# Patient Record
Sex: Female | Born: 1945
Health system: Southern US, Community
[De-identification: ages and names within clinical notes are randomized; demographics above are authoritative.]

## PROBLEM LIST (undated history)

## (undated) DIAGNOSIS — T7840XA Allergy, unspecified, initial encounter: Secondary | ICD-10-CM

## (undated) DIAGNOSIS — R220 Localized swelling, mass and lump, head: Secondary | ICD-10-CM

## (undated) DIAGNOSIS — Z91018 Allergy to other foods: Secondary | ICD-10-CM

## (undated) DIAGNOSIS — L509 Urticaria, unspecified: Secondary | ICD-10-CM

## (undated) HISTORY — PX: OTHER SURGICAL HISTORY: SHX169

## (undated) HISTORY — DX: Urticaria, unspecified: L50.9

## (undated) HISTORY — PX: REDUCTION MAMMAPLASTY: SUR839

## (undated) HISTORY — PX: ABDOMINAL HYSTERECTOMY: SHX81

## (undated) HISTORY — PX: HERNIA REPAIR: SHX51

## (undated) HISTORY — DX: Allergy, unspecified, initial encounter: T78.40XA

## (undated) HISTORY — DX: Allergy to other foods: Z91.018

## (undated) HISTORY — DX: Localized swelling, mass and lump, head: R22.0

---

## 1997-07-08 ENCOUNTER — Other Ambulatory Visit: Admission: RE | Admit: 1997-07-08 | Discharge: 1997-07-08 | Payer: Self-pay | Admitting: Obstetrics & Gynecology

## 1998-07-15 ENCOUNTER — Other Ambulatory Visit: Admission: RE | Admit: 1998-07-15 | Discharge: 1998-07-15 | Payer: Self-pay | Admitting: Obstetrics & Gynecology

## 1998-12-30 ENCOUNTER — Encounter: Payer: Self-pay | Admitting: Obstetrics & Gynecology

## 1998-12-30 ENCOUNTER — Encounter: Admission: RE | Admit: 1998-12-30 | Discharge: 1998-12-30 | Payer: Self-pay | Admitting: Obstetrics & Gynecology

## 1999-06-08 ENCOUNTER — Encounter: Payer: Self-pay | Admitting: Orthopedic Surgery

## 1999-06-08 ENCOUNTER — Encounter: Admission: RE | Admit: 1999-06-08 | Discharge: 1999-06-08 | Payer: Self-pay | Admitting: Orthopedic Surgery

## 1999-08-18 ENCOUNTER — Other Ambulatory Visit: Admission: RE | Admit: 1999-08-18 | Discharge: 1999-08-18 | Payer: Self-pay | Admitting: Obstetrics & Gynecology

## 2000-01-05 ENCOUNTER — Encounter: Admission: RE | Admit: 2000-01-05 | Discharge: 2000-01-05 | Payer: Self-pay | Admitting: Obstetrics & Gynecology

## 2000-01-05 ENCOUNTER — Encounter: Payer: Self-pay | Admitting: Obstetrics & Gynecology

## 2000-09-07 ENCOUNTER — Other Ambulatory Visit: Admission: RE | Admit: 2000-09-07 | Discharge: 2000-09-07 | Payer: Self-pay | Admitting: Obstetrics & Gynecology

## 2001-01-09 ENCOUNTER — Encounter: Admission: RE | Admit: 2001-01-09 | Discharge: 2001-01-09 | Payer: Self-pay | Admitting: Obstetrics & Gynecology

## 2001-01-09 ENCOUNTER — Encounter: Payer: Self-pay | Admitting: Obstetrics & Gynecology

## 2001-10-03 ENCOUNTER — Other Ambulatory Visit: Admission: RE | Admit: 2001-10-03 | Discharge: 2001-10-03 | Payer: Self-pay | Admitting: Obstetrics & Gynecology

## 2001-10-16 ENCOUNTER — Encounter: Admission: RE | Admit: 2001-10-16 | Discharge: 2001-10-16 | Payer: Self-pay | Admitting: Family Medicine

## 2001-10-16 ENCOUNTER — Encounter: Payer: Self-pay | Admitting: Family Medicine

## 2002-01-16 ENCOUNTER — Encounter: Admission: RE | Admit: 2002-01-16 | Discharge: 2002-01-16 | Payer: Self-pay | Admitting: Obstetrics & Gynecology

## 2002-01-16 ENCOUNTER — Encounter: Payer: Self-pay | Admitting: Obstetrics & Gynecology

## 2002-01-22 ENCOUNTER — Encounter: Admission: RE | Admit: 2002-01-22 | Discharge: 2002-01-22 | Payer: Self-pay | Admitting: Diagnostic Radiology

## 2002-10-28 ENCOUNTER — Other Ambulatory Visit: Admission: RE | Admit: 2002-10-28 | Discharge: 2002-10-28 | Payer: Self-pay | Admitting: Obstetrics & Gynecology

## 2002-11-27 ENCOUNTER — Encounter: Admission: RE | Admit: 2002-11-27 | Discharge: 2002-11-27 | Payer: Self-pay | Admitting: Family Medicine

## 2002-11-27 ENCOUNTER — Encounter: Payer: Self-pay | Admitting: Family Medicine

## 2003-01-06 ENCOUNTER — Encounter: Payer: Self-pay | Admitting: Obstetrics & Gynecology

## 2003-01-24 ENCOUNTER — Encounter: Admission: RE | Admit: 2003-01-24 | Discharge: 2003-01-24 | Payer: Self-pay | Admitting: Diagnostic Radiology

## 2003-08-06 ENCOUNTER — Encounter: Admission: RE | Admit: 2003-08-06 | Discharge: 2003-08-06 | Payer: Self-pay | Admitting: Family Medicine

## 2003-10-30 ENCOUNTER — Encounter: Admission: RE | Admit: 2003-10-30 | Discharge: 2003-10-30 | Payer: Self-pay | Admitting: Family Medicine

## 2003-11-06 ENCOUNTER — Other Ambulatory Visit: Admission: RE | Admit: 2003-11-06 | Discharge: 2003-11-06 | Payer: Self-pay | Admitting: Obstetrics & Gynecology

## 2004-01-07 ENCOUNTER — Encounter: Admission: RE | Admit: 2004-01-07 | Discharge: 2004-01-07 | Payer: Self-pay | Admitting: Obstetrics & Gynecology

## 2004-07-23 ENCOUNTER — Encounter: Admission: RE | Admit: 2004-07-23 | Discharge: 2004-07-23 | Payer: Self-pay | Admitting: Family Medicine

## 2004-12-14 ENCOUNTER — Other Ambulatory Visit: Admission: RE | Admit: 2004-12-14 | Discharge: 2004-12-14 | Payer: Self-pay | Admitting: Obstetrics & Gynecology

## 2004-12-15 ENCOUNTER — Encounter: Admission: RE | Admit: 2004-12-15 | Discharge: 2004-12-15 | Payer: Self-pay | Admitting: Obstetrics & Gynecology

## 2005-01-13 ENCOUNTER — Encounter: Admission: RE | Admit: 2005-01-13 | Discharge: 2005-01-13 | Payer: Self-pay | Admitting: Obstetrics & Gynecology

## 2005-03-14 ENCOUNTER — Encounter: Admission: RE | Admit: 2005-03-14 | Discharge: 2005-03-14 | Payer: Self-pay | Admitting: Obstetrics & Gynecology

## 2005-04-21 ENCOUNTER — Encounter: Admission: RE | Admit: 2005-04-21 | Discharge: 2005-04-21 | Payer: Self-pay | Admitting: Family Medicine

## 2005-05-11 ENCOUNTER — Encounter: Admission: RE | Admit: 2005-05-11 | Discharge: 2005-05-11 | Payer: Self-pay | Admitting: Family Medicine

## 2005-05-30 ENCOUNTER — Encounter: Admission: RE | Admit: 2005-05-30 | Discharge: 2005-05-30 | Payer: Self-pay | Admitting: Obstetrics & Gynecology

## 2005-06-02 ENCOUNTER — Inpatient Hospital Stay (HOSPITAL_COMMUNITY): Admission: RE | Admit: 2005-06-02 | Discharge: 2005-06-06 | Payer: Self-pay | Admitting: General Surgery

## 2005-06-02 ENCOUNTER — Encounter (INDEPENDENT_AMBULATORY_CARE_PROVIDER_SITE_OTHER): Payer: Self-pay | Admitting: Specialist

## 2005-06-02 ENCOUNTER — Encounter (INDEPENDENT_AMBULATORY_CARE_PROVIDER_SITE_OTHER): Payer: Self-pay | Admitting: *Deleted

## 2005-10-20 ENCOUNTER — Encounter: Admission: RE | Admit: 2005-10-20 | Discharge: 2005-10-20 | Payer: Self-pay | Admitting: General Surgery

## 2005-12-31 ENCOUNTER — Inpatient Hospital Stay (HOSPITAL_COMMUNITY): Admission: RE | Admit: 2005-12-31 | Discharge: 2006-01-01 | Payer: Self-pay | Admitting: General Surgery

## 2006-01-16 ENCOUNTER — Encounter: Admission: RE | Admit: 2006-01-16 | Discharge: 2006-01-16 | Payer: Self-pay | Admitting: Obstetrics & Gynecology

## 2006-02-17 ENCOUNTER — Ambulatory Visit: Payer: Self-pay | Admitting: Internal Medicine

## 2006-02-24 ENCOUNTER — Ambulatory Visit (HOSPITAL_BASED_OUTPATIENT_CLINIC_OR_DEPARTMENT_OTHER): Admission: RE | Admit: 2006-02-24 | Discharge: 2006-02-24 | Payer: Self-pay | Admitting: Internal Medicine

## 2006-03-05 ENCOUNTER — Ambulatory Visit: Payer: Self-pay | Admitting: Internal Medicine

## 2006-03-14 ENCOUNTER — Ambulatory Visit: Payer: Self-pay | Admitting: Internal Medicine

## 2006-06-13 ENCOUNTER — Ambulatory Visit: Payer: Self-pay | Admitting: Internal Medicine

## 2007-01-18 ENCOUNTER — Encounter: Admission: RE | Admit: 2007-01-18 | Discharge: 2007-01-18 | Payer: Self-pay | Admitting: Obstetrics & Gynecology

## 2007-11-26 ENCOUNTER — Encounter: Admission: RE | Admit: 2007-11-26 | Discharge: 2007-11-26 | Payer: Self-pay | Admitting: Family Medicine

## 2008-01-21 ENCOUNTER — Encounter: Admission: RE | Admit: 2008-01-21 | Discharge: 2008-01-21 | Payer: Self-pay | Admitting: Obstetrics & Gynecology

## 2008-03-10 ENCOUNTER — Encounter: Admission: RE | Admit: 2008-03-10 | Discharge: 2008-03-10 | Payer: Self-pay | Admitting: Obstetrics & Gynecology

## 2008-03-24 ENCOUNTER — Encounter: Admission: RE | Admit: 2008-03-24 | Discharge: 2008-03-24 | Payer: Self-pay | Admitting: Obstetrics & Gynecology

## 2008-04-03 ENCOUNTER — Encounter: Admission: RE | Admit: 2008-04-03 | Discharge: 2008-04-03 | Payer: Self-pay | Admitting: Family Medicine

## 2008-05-14 ENCOUNTER — Encounter: Admission: RE | Admit: 2008-05-14 | Discharge: 2008-05-14 | Payer: Self-pay | Admitting: Podiatry

## 2008-10-06 ENCOUNTER — Encounter: Admission: RE | Admit: 2008-10-06 | Discharge: 2008-10-06 | Payer: Self-pay | Admitting: Family Medicine

## 2008-12-03 ENCOUNTER — Encounter: Admission: RE | Admit: 2008-12-03 | Discharge: 2008-12-03 | Payer: Self-pay | Admitting: Family Medicine

## 2008-12-10 ENCOUNTER — Encounter: Admission: RE | Admit: 2008-12-10 | Discharge: 2008-12-10 | Payer: Self-pay | Admitting: Orthopedic Surgery

## 2008-12-10 ENCOUNTER — Encounter: Admission: RE | Admit: 2008-12-10 | Discharge: 2008-12-10 | Payer: Self-pay | Admitting: Specialist

## 2008-12-31 ENCOUNTER — Encounter: Admission: RE | Admit: 2008-12-31 | Discharge: 2008-12-31 | Payer: Self-pay | Admitting: Family Medicine

## 2009-01-21 ENCOUNTER — Encounter: Admission: RE | Admit: 2009-01-21 | Discharge: 2009-01-21 | Payer: Self-pay | Admitting: Obstetrics & Gynecology

## 2010-01-04 ENCOUNTER — Encounter: Admission: RE | Admit: 2010-01-04 | Discharge: 2010-01-04 | Payer: Self-pay | Admitting: Obstetrics & Gynecology

## 2010-07-16 NOTE — Assessment & Plan Note (Signed)
Ashley Knapp                             PULMONARY OFFICE NOTE   NAME:Ashley Knapp, Ashley Knapp                   MRN:          [id number]  DATE:03/14/2006                            DOB:          January 09, 1946    PROBLEM:  Obstructive sleep apnea with insomnia.   HISTORY:  She returns after her sleep study with basic complaint not  changed.  Since her total hysterectomy she has had difficulty initiating  and maintaining sleep.  Her sleep study December 28 demonstrated mild  obstructive apnea with an index of 9.3 per hour.  Events were mainly  noted while she was sleeping on her back.  At home she prefers not to  sleep on her back because of positional backache.  She did not have leg  jerks or other events disturbing her sleep.  She took no bedtime  medication on that night and slept 315 minutes with a sleep efficiency  of 75% and awake after sleep onset for 83 minutes during the study  night.  The sleep pattern sounds like what she is describing at home.  Either Ambien CR 12.5 mg or Halcyon 0.25 mg will work well for her.  She  would just prefer not to need a medication to sleep comfortably.  We  spent time discussing available tools including cognitive behavioral  therapy and basics of good sleep hygiene, and we had a comparison  discussion of various sleep medications.  She was interested in trying  Rozerem.  She is not interested in trying CPAP.  With scores in her  range, I do not think CPAP is necessary from a medical standpoint.   MEDICATION:  1. Ambien CR 12.5 mg.  2. Estratest.  3. Halcyon 0.25 mg.  4. Occasional over-the-counter sinus medication.   No medication allergy.   OBJECTIVE:  Mildly obese with a small mandible, no stridor.  Lungs are  clear, breathing is unlabored.  Pulse is regular.  She seems calm and  comfortable.   IMPRESSION:  1. Insomnia with obstructive sleep apnea.  2. Sleep apnea component is very mild with an index of  9.3.   PLAN:  1. Try Rozerem 8 mg q.h.s. taken 20-30 minutes before bedtime, with      samples and backup prescription provided.  2. She will continue using either Ambien CR 12.5 mg or Halcyon 0.25 mg      until she is established on Rozerem, after a week to 10 days.  At      that point she will begin trying to skip the sedating medication.      I have discussed this process with her.  3. I have offered referral to behavioral medicine as an option for her      to explore nonpharmaceutical assistance with her sleep, but it      still seems that it was a hormone change at the time of her      hysterectomy that has precipitated her change in sleep pattern and      I would expect her gradually to get better over the next couple of  years.   We will schedule return 3 months, earlier p.r.n.     Clinton D. Maple Hudson, MD, Ashley Knapp, Ashley Knapp  Electronically Signed    CDY/MedQ  DD: 03/14/2006  DT: 03/15/2006  Job #: [id number]   cc:   Freddy Finner, M.D.  Bryan Lemma. Manus Gunning, M.D.

## 2010-07-16 NOTE — H&P (Signed)
Ashley Knapp, Ashley Knapp            ACCOUNT NO.:  [id number]   MEDICAL RECORD NO.:  [id number]          PATIENT TYPE:  INP   LOCATION:  NA                            FACILITY:  [facility]   PHYSICIAN:  Freddy Finner, M.D.   DATE OF BIRTH:  Jun 22, 1945   DATE OF ADMISSION:  06/02/2005  DATE OF DISCHARGE:                                HISTORY & PHYSICAL   ADMISSION DIAGNOSIS:  1.  Cholelithiasis, symptomatic.  2.  Complex cystic left adnexal mass, uterine leiomyomata, known significant      pelvic adhesions, long history of chronic pelvic pain, recent episode of      post menopausal bleeding with benign endometrium on endometrial biopsy.   HISTORY OF PRESENT ILLNESS:  The patient is a 65 year old black single  female, gravida 3, para 0, with a long-standing history of GYN problems  including chronic pelvic pain, uterine leiomyomata, episodes of abnormal  uterine bleeding, previous pelvic operative procedures including salpingo-  oophorectomy for ectopic pregnancy (presumably on the left), laparotomy for  lysis of adhesions and treatment of infertility.  She had laparoscopic  sterilization in 1980 with cauterization and division of her right fallopian  tube.  For years, she has been known to have a complex adnexal mass on the  left with cystic components.  This has, however, been very stable.  In the  fairly recent past, she has become symptomatic with gallstones and has  elected to proceed with cholecystectomy.  Because of her wish to continue  with hormone replacement therapy, her history of chronic pelvic pain, and  nonsignificant pelvic pathology, she has elected to combine the procedures  and she is now admitted for laparoscopic cholecystectomy by Dr. Lurene Shadow to be  followed by laparoscopically assisted vaginal hysterectomy with salpingo-  oophorectomy and lysis of pelvic adhesions.   REVIEW OF SYMPTOMS:  Her current review of systems is otherwise negative.  She does have episodic  symptoms with hemorrhoids.   Recent significant radiological examinations have included virtual  colonoscopy which was negative.  She also had MRI for evaluation of the  spine with findings of lumbar disc disease.  Abdominal ultrasounds have been  obtained in the office at least twice in the last 4-5 months with findings  of numerous leiomyomata ranging in size from 2.5 cm to 8 mm and continuing  to show persistent complex cystic left adnexal mass.   PAST MEDICAL HISTORY:  She has no known significant chronic medical  illnesses.   PAST SURGICAL HISTORY:  Tendon release of her left wrist, arthroscopic  surgery of her left knee.   ALLERGIES:  She has no known drug allergies.   CURRENT MEDICATIONS:  Her only current regular medication is CombiPatch  50/140 for her hormone replacement therapy.  She is not a cigarette smoker.  She uses alcohol only occasionally.   FAMILY HISTORY:  Her father died of lung cancer and emphysema.  Her mother  was hypertensive, diabetic, and had coronary artery disease and died of  renal failure.   PHYSICAL EXAMINATION:  HEENT:  Grossly within normal limits.  Thyroid gland is not palpably  enlarged  to my examination.  VITAL SIGNS:  Blood pressure in the office 138/82, weight 223.  CHEST:  Clear to auscultation throughout without rhonchi or rales present.  HEART:  Normal sinus rhythm without murmurs, gallops, and rubs.  BREASTS:  Exam is considered to be normal, no palpable masses, no nipple  discharge, no skin change, there are scars consistent with previous  reduction mammoplasty.  ABDOMEN:  Soft, nontender, without organomegaly or palpable masses.  EXTREMITIES:  No cyanosis, clubbing, and edema.  PELVIC:  External genitalia, vagina, and cervix were normal to inspection.  Bimanual reveals the uterus to be slightly enlarged, there is some adnexal  tenderness on the left but no definitive mass palpable just to clinical  exam.  RECTAL:  Normal.   Rectovaginal exam confirms the above findings.   ASSESSMENT:  1.  Symptomatic cholelithiasis.  2.  Symptomatic pelvic adhesive disease and uterine leiomyomata with post      menopausal bleeding and complex left adnexal mass.   PLAN:  Laparoscopic cholecystectomy to be followed by laparoscopically  assisted vaginal hysterectomy, lysis of pelvic adhesions, and unilateral  salpingo-oophorectomy.      Freddy Finner, M.D.  Electronically Signed     WRN/MEDQ  D:  06/01/2005  T:  06/01/2005  Job:  [id number]

## 2010-07-16 NOTE — Op Note (Signed)
NAMEALNISA, Ashley Knapp            ACCOUNT NO.:  [id number]   MEDICAL RECORD NO.:  [id number]          PATIENT TYPE:  INP   LOCATION:  9318                          FACILITY:  [facility]   PHYSICIAN:  Freddy Finner, M.D.   DATE OF BIRTH:  December 24, 1945   DATE OF PROCEDURE:  06/02/2005  DATE OF DISCHARGE:                                 OPERATIVE REPORT   PREOPERATIVE DIAGNOSES:  1.  Uterine leiomyomata.  2.  Persistent complex left adnexal mass.  3.  Known pelvic adhesions.   POSTOPERATIVE DIAGNOSES:  1.  Uterine leiomyomata.  2.  Persistent complex left adnexal mass.  3.  Known pelvic adhesions involving bladder, uterus, left adnexa, right      adnexa and lower abdominal wall.  4.  Complex peritoneal cyst of left adnexa.   OPERATIVE PROCEDURES:  1.  Laparoscopic inspection of pelvis following cholecystectomy performed by      Dr. Talmage Nap with lack of ability to visualize the pelvic structures.  2.  Total abdominal hysterectomy.  3.  Right salpingo-oophorectomy.  4.  Lysis of pelvic adhesions.  5.  Repair of perforation of bladder.  6.  Suprapubic cystotomy.   ANESTHESIA:  General.   ESTIMATED INTRAOPERATIVE BLOOD LOSS:  Total for both procedures 300 mL   OTHER INTRAOPERATIVE COMPLICATIONS:  None.   SURGEON:  Freddy Finner, M.D.   ASSISTANT:  Zelphia Cairo, MD   DESCRIPTION OF PROCEDURE:  The patient's history is recorded in detail in  the admission note, but briefly she has a long history of pelvic pain,  complex adnexal mass and fibroids; and recently had begun having  dysfunctional uterine bleeding with hormone replacement therapy.  She was  also recently found to have cholelithiasis which has become symptomatic.  A  planned joint procedure was arranged.  Dr. Talmage Nap performed the laparoscopic  cholecystectomy with my assistance prior to the hysterectomy.  The umbilical  port was then used to attempt to visualize the pelvic structures. This was  virtually impossible because  of the extensive amount of adhesions. At this  point it was elected to proceed with an abdominal approach to the procedure.   The umbilical trocar was removed and the fascial suture which was a  pursestring, Dexon-type suture, was tied to close the fascia at this level.  The skin incision was closed with interrupted subcuticular sutures of 3-0  Dexon.  There were three other laparoscopic ports from the cholecystectomy  which were also closed by me with interrupted subcuticular sutures of 3-0  Dexon and Steri-Strips.  The abdomen was reprepped as was the vagina.  A  Foley catheter was then placed and left in place.  Sterile drapes were  applied.   A lower abdominal transverse incision was made through an old scar and this  was carried sharply on down to fascia.  The subcutaneous bleeding vessels  were controlled with a Bovie.  The fascia was entered sharply and extended  to the extent of skin incision in a transverse direction with blunt and  sharp dissection.  A rectus sheath was developed superiorly and inferiorly  with blunt-and-sharp dissection.  Blunt dissection was attempted in the  midline.  Some diastasis of the rectus muscles was already present.  With  blunt dissection, the bladder was entered and was far advanced on the  anterior abdominal peritoneum.  This was on the dome of the bladder and no  where near the trigone.  The bladder opening was left in place until a  completion of the hysterectomy for assistance in identifying the bladder at  its reflection off of the uterus.  There were adhesions of the anterior cul-  de-sac.  These were carefully, bluntly and sharply taken down.  There was a  loop of small intestine adherent just superior to the entry point into the  peritoneum which was accomplished with careful blunt and sharp dissection.  The adhesions of the ileum were lysed sharply and carefully.  All adhesions  above the incision were not lysed.   The adhesions that  were lysed had created a loop in the bowel and it was  felt appropriate to release these.  With careful finger dissection, the  uterus could then be palpated and the clear, smooth cystic mass on the left  was identified.  It was consistent with a peritoneal cyst.  The left adnexa  was absent, but there were extensive adhesions where the left tube and ovary  had been removed before for ectopic pregnancy. There were adhesions along  the right broad ligament.  The proximal broad ligaments were grasped with  Kelly's.  The fallopian tube distal to the previous tubal ligation was  freed.  The ligature device was then used to develop the infundibulopelvic  ligament and 3 different pedicles.  The fallopian tube and ovary distal to  the ligated portion of the tube was then removed.  The round ligament was  then taken and upper broad ligament taken with the ligature.  Each pedicle  was divided consecutively.   Attention was turned anteriorly to completely release the bladder from the  uterus and the lower uterine segment and this was accomplished with sharp  and blunt dissection.  The uterine artery pedicles were then taken with a  ligature.  Cardinal ligaments were taken with ligature and divided. Bleeding  source near the uterine artery on the right was controlled with a suture  ligature of #0 Monocryl.  The angle of the vagina on the left and the  uterosacral ligament was clamped with a Heaney clamp and sharply divided.  This entered the vaginal canal.  This suture was ligated with a suture  ligature of #0 Monocryl in a Heaney fashion.  The cervix was then excised  with the Satinsky scissors. An angle suture was placed on the right hand  side anchoring the uterosacral to the angle of the vagina.  Additional  sutures were required in this location for complete hemostasis.   The cuff was closed with figure-of-eights of #0 Monocryl.  A horizontally oriented suture was required on the anterior cuff  for complete hemostasis  with some bleeding noted under the bladder reflection at that location.  This did produce complete hemostasis.  Irrigation was carried out and  hemostasis was still noted to be complete.  The defect in the bladder was  then closed using a purse-string suture of 2-0 chromic for the first layer  of closure.  Silastic Foley was placed through the skin in a suprapubic  location and placed into the bladder.  The pursestring suture was tied to  complete the closure around the Foley.  Interrupted sutures  of 2-0 chromic  were then used to create a second layer of closure in a vertical fashion.   Foley catheter was anchored to the skin with a 2-0 Nylon.  Fascia was closed  with running #0 PDS running from an angle to the midline on each side with a  catheter left in the midline through an opening in the fascia.  Subcutaneous  tissue was approximated with running 2-0 plain.  Skin was closed with skin  staples.  The patient tolerated the operative procedure well.  Urine output  was adequate at this point in time and urine was clearing.  She was awakened  and taken to the respiratory rate in good condition.      Freddy Finner, M.D.  Electronically Signed     WRN/MEDQ  D:  06/02/2005  T:  06/02/2005  Job:  [id number]

## 2010-07-16 NOTE — Assessment & Plan Note (Signed)
[facility]                             PULMONARY OFFICE NOTE   NAME:Knapp, Ashley OFFIELD                   MRN:          [id number]  DATE:06/13/2006                            DOB:          09-26-1945    PROBLEM LIST:  Obstructive sleep apnea with insomnia.   HISTORY:  Rozerem alone was no help. She used up the samples and quit.  Providing Rozerem with Ambien CR taken around 11:30 or 12:00 and getting  up at 7:30 seemed to carry on too long and Ambien CR by itself still  leaves her feeling somewhat groggy in the daytime. She did not try the  website that I had given her. We had discussed her very mild sleep apnea  with an index of 9.3 again. She may have lost a couple of pounds and is  trying to sleep off her back. She did not tolerate Lunesta because of  aftertaste.   MEDICATIONS:  1. Ambien CR 12.5 mg.  2. Estratest.   No medication allergy.   OBJECTIVE:  VITAL SIGNS:  Weight 212 pounds which is still heavy for  her. Blood pressure 130/74, pulse 70, room air saturation 98%.  GENERAL:  She is quite alert. No distress.  HEENT:  Nasal airway is not obstructed.  HEART:  Heart sounds are normal, no tremor.   IMPRESSION:  1. Insomnia since hysterectomy.  2. Mild sleep apnea with an index of 9.   PLAN:  1. Education again about weight loss and good sleep hygiene.  2. Try changing Ambien CR to Ambien 10 mg generic for a shorter half      life to avoid the daytime grogginess and aim for prn use as      discussed.  3. We are scheduling a return in 1 yr but earlier prn     Clinton D. Maple Hudson, MD, Tonny Bollman, FACP  Electronically Signed    CDY/MedQ  DD: 06/13/2006  DT: 06/14/2006  Job #: [id number]   cc:   Freddy Finner, M.D.  Bryan Lemma. Manus Gunning, M.D.

## 2010-07-16 NOTE — Op Note (Signed)
[name], [name]            ACCOUNT NO.:  [id number]   MEDICAL RECORD NO.:  [id number]          PATIENT TYPE:  INP   LOCATION:  9318                          FACILITY:  [facility]   PHYSICIAN:  Ashley Knapp, M.D.   DATE OF BIRTH:  March 06, 1945   DATE OF PROCEDURE:  06/02/2005  DATE OF DISCHARGE:                                 OPERATIVE REPORT   PREOPERATIVE DIAGNOSIS:  Chronic calculous cholecystitis.   POSTOPERATIVE DIAGNOSIS:  Chronic calculous cholecystitis.   PROCEDURE:  Laparoscopic cholecystectomy with intraoperative cholangiogram.   SURGEON:  Ashley Knapp, M.D.   ASSISTANT:  Ashley Knapp, M.D.   ANESTHESIA:  General.   NOTE:  Ashley Knapp is a 65 year old female presenting with symptoms of upper  abdomen pain, bloating and nausea.  She was discovered on MRI to have  cholelithiasis; this was later confirmed on gallbladder ultrasound.  Liver  functions have remained normal, no chills or fevers.  The patient comes to  the operating room now for laparoscopic cholecystectomy and intraoperative  cholangiogram.  At the same time, she is also being evaluated by Ashley Knapp and it is planned to do at this operation, a laparoscopic-assisted  total abdominal hysterectomy.   The LAVH will be dictated in a separate note by Ashley Knapp.   The risks and potential benefits of surgery have been fully discussed with  the patient, all questions answered and consent obtained.   DESCRIPTION OF PROCEDURE:  Following the induction of satisfactory general  anesthesia with the patient positioned supinely, the abdomen is prepped and  draped to be included in a sterile operative field.  Open laparoscopy is  carried out in an infraumbilical incision and with insertion of a Hasson  cannula and insufflation of the peritoneal cavity to 14 mmHg pressure using  carbon dioxide.  Camera is inserted and visual exploration is carried out.  The liver edges are sharp and liver surface is smooth.  The  anterior gastric  wall and duodenal sweep appeared to be normal.  There are multiple adhesions  up to the gallbladder wall.  On inspection the pelvis, there are multiple  adhesions in the pelvis from previous surgical procedures.  Under direct  vision, epigastric and lateral ports are placed and the gallbladder is then  grasped and retracted cephalad and dissection carried down toward the  ampulla with isolation of the cystic artery and cystic duct.  The cystic  artery was passed anteriorly and it was then doubly clipped and transected.  The cystic duct was isolated, clipped proximally and opened.  A cystic duct  cholangiogram was then carried out with a Reddick catheter passed into the  abdomen through a 16-gauge Angiocath and into the cystic duct.  The  resulting cholangiogram was carried out under fluoroscopic guidance,  injecting 0.5% Hypaque into the biliary system.  The cholangiogram showed  mildly dilated common duct, no filling defect, with normal hepatic radicles  on the right and left.  There was prompt flow of contrast into the duodenum.   The cystic duct catheter was then removed and the cystic duct was triply  clipped and  transected.  The gallbladder was then dissected free from the  liver bed using electrocautery and maintaining hemostasis throughout the  course of dissection.  At the end of the dissection, the liver bed was  thoroughly checked for hemostasis and noted to be dry.  The right upper  quadrant was thoroughly irrigated with multiple aliquots of normal saline.  The gallbladder was then placed in the Endopouch and then retrieved through  the umbilical incision without difficulty.  Under direct visualization, both  the lateral trocars were removed; the epigastric and umbilical trocars were  left in place, at which point Ashley Knapp assumed charge of the operation and  begun dissection for his LAVH.      Ashley Knapp, M.D.  Electronically Signed     PB/MEDQ   D:  06/02/2005  T:  06/03/2005  Job:  [id number]   cc:   Ashley Knapp, M.D.  Fax: [phone]

## 2010-07-16 NOTE — Op Note (Signed)
Ashley Knapp, Ashley Knapp            ACCOUNT NO.:  [id number]   MEDICAL RECORD NO.:  [id number]          PATIENT TYPE:  OIB   LOCATION:  1606                         [facility]ACILITY:  [facility]   PHYSICIAN:  Leonie Man, M.D.   DATE OF BIRTH:  1945-10-10   DATE OF PROCEDURE:  12/30/2005  DATE OF DISCHARGE:                                 OPERATIVE REPORT   PREOPERATIVE DIAGNOSIS:  Ventral hernia with incarcerated urinary bladder.   POSTOPERATIVE DIAGNOSIS:  Ventral hernia with incarcerated urinary bladder.   PROCEDURE:  Reduction of urinary bladder and repair of ventral hernia.   CO-SURGEONS:  Leonie Man, M.D.  Veverly Fells. Vernie Ammons, M.D.   ANESTHESIA:  General.   SPECIMENS:  There were no specimens sent to lab.   ESTIMATED BLOOD LOSS:  Minimal.   COMPLICATIONS:  None.   DISPOSITION:  The patient returned to the PACU in excellent condition.   INDICATIONS:  The patient is a 65 year old female who underwent recent  abdominal hysterectomy and placement of a suprapubic catheter during the  course of the operation.  She subsequently developed herniation of her  bladder through the fascial defect and is having some voiding symptoms and  pain because of it.  On the CT scan, it shows a significant herniation of  approximately 1/3 of the bladder into the hernia sac and under the skin.  The patient comes to the operating room now after the risks and potential  benefits of surgery have been fully discussed, all questions answered, and  consent obtained.   DESCRIPTION OF PROCEDURE:  The patient is positioned supinely.  A large  Foley catheter was placed within the bladder.  Following the induction of  satisfactory general anesthesia, the abdomen was prepped and draped to be  included in a sterile operative field.  An elliptical incision is made over  the old scar cicatrix and this was deepened through skin and subcutaneous  tissue down to the hernia sac.  The hernia sac was dissected free,  carrying  the superior flap towards the umbilicus and inferior flap was carried down  over the pubic tubercle onto the pudendal skin.  The hernia edges were  identified and the bladder was dissected free from the hernia defect in a  complete circumferential fashion freeing the entire fascia up.  The bladder  was then reduced back into the retroperitoneal space and the rectus fascia  was closed over the bladder with interrupted sutures of #1 Novofil.  An  onlay patch of mesh was then placed over the suture line and sutured into  the fascial wall with multiple sutures of #1 Novofil.  I then used a  Protacker to secure the remainder of the mesh into the fascia in two rows of  staples.  A 19-French Blake drain was placed under the flaps for additional  drainage.  Sponge, instruments and sharp counts were verified.  The  subcutaneous tissues were then thoroughly irrigated with normal saline and  the subcutaneous tissues closed with a  running 2-0 Vicryl suture.  The skin was closed with running 4-0 Monocryl  suture and then reinforced with Steri-Strips.  Sterile dressings were  applied, the anesthetic reversed, and the patient removed from the operating  room to the recovery room in stable condition.  She tolerated the procedure  well.      Leonie Man, M.D.  Electronically Signed     PB/MEDQ  D:  12/30/2005  T:  12/30/2005  Job:  [id number]   cc:   Veverly Fells. Vernie Ammons, M.D.  Fax: [phone]

## 2010-07-16 NOTE — Discharge Summary (Signed)
NAMEERRIN, WHITELAW            ACCOUNT NO.:  [id number]   MEDICAL RECORD NO.:  [id number]          PATIENT TYPE:  INP   LOCATION:  9318                          FACILITY:  [facility]   PHYSICIAN:  Freddy Finner, M.D.   DATE OF BIRTH:  Jan 09, 1946   DATE OF ADMISSION:  06/02/2005  DATE OF DISCHARGE:  06/06/2005                                 DISCHARGE SUMMARY   DISCHARGE DIAGNOSES:  Symptomatic cholelithiasis and uterine leiomyomata,  dense pelvic adhesions with peritoneal inclusion cyst of left adnexa,  uterine fibroids, chronic pelvic pain, dysfunctional uterine bleeding.   OPERATIVE PROCEDURE:  Laparoscopic cholecystectomy, total abdominal  hysterectomy, right salpingo-oophorectomy, resection of peritoneal inclusion  cyst of left adnexa, extensive adhesiolysis of pelvic adhesions and bowel  adhesions.   INTRAOPERATIVE COMPLICATION:  Blunt entry of bladder on abdominal  hysterectomy.   SECONDARY PROCEDURE:  Repair bladder defect and suprapubic cystotomy.   OTHER COMPLICATIONS:  None.   DISPOSITION:  The patient is in satisfactory improved condition at time of  discharge.  She is tolerating a regular diet.  She is ambulating without  difficulty.  She is having adequate bowel function.  She is discharged home  on a regular diet. She is to have progressively increasing physical activity  but no heavy lifting, no vaginal entry.  She is to return to my office  approximately 1 week from surgery for removal of suprapubic catheter.  She  is to follow-up with Dr. Lurene Shadow as per his instructions for postoperative  visit there.   DISCHARGE MEDICATIONS:  1.  Percocet 5/325 one or two every 4-6 hours as needed for pain.  2.  In addition she is to use ibuprofen 600 mg every 6 hours as needed for      pain.  3.  She is to complete a Z-Pak which she has been taking in hospital for      bronchitis.  4.  She is to use Vivelle 0.1 dot every 3-4 days for hormone placement      therapy.   HISTORY OF PRESENT ILLNESS:  Details of present illness are recorded in the  admission note. Briefly the patient had documented cholelithiasis with  recent onset of symptoms which would require surgery.  She has had  longstanding history of complex pelvic mass, fibroids, and episodes of  pelvic pain and recently some dysfunctional bleeding. She was admitted now  for combined surgery.  Physical findings on admission were more remarkable  for ultrasound documented issues as noted above.   LABORATORY STUDIES DURING THIS ADMISSION:  Admission hemoglobin of 13.7 with  normal CBC. Chemistry profile was normal except for random glucose of 108.  Prothrombin time and PTT were normal.  Urinalysis was normal.  Postoperatively hemoglobin was 11.1 on the first postoperative day and was  10.6 on the third postoperative day.   HOSPITAL COURSE:  The patient was admitted on the morning of surgery. She  was placed in PAS hose.  She was given a gram of Ancef preoperatively.  She  was brought to the operating room and prepped in the usual fashion for  cholecystectomy which was  performed by Dr. Lurene Shadow.  This procedure was  without complication.  Laparoscopic examination of the pelvis and lower  abdomen was carried out in hopes of proceeding with laparoscopically-  assisted vaginal hysterectomy.  It was completely obvious immediately that  this would not be appropriate approach.  For that reason the patient was  then reprepped and draped including vaginal prep for total abdominal  hysterectomy.  Foley catheter was indwelling.  That procedure was  accomplished with one intraoperative complication and that is entry into the  bladder  on blunt dissection in the attempt to identify the peritoneal  cavity.  The bladder was advanced far up on the anterior abdominal wall.  With that exception no other complications were encountered and that  procedure was accomplished without significant further difficulty which   included extensive lysis of pelvic and lower abdominal adhesions, right  salpingo-oophorectomy, abdominal hysterectomy.  Postoperatively she had no  major complications.  She did have known bronchitis from preoperatively and  for that reason she was started on Z-Pak which she continues to the present  time. With this protocol she has remained afebrile throughout her hospital  stay. The abdominal incision is healing well. Suprapubic catheter is  draining well.  The upper abdominal incisions are intact and dry.  Her  condition is considered to be good on the fourth postoperative day and she  is discharged home with further disposition as noted above.  Pathology  studies were still pending at time of discharge.      Freddy Finner, M.D.  Electronically Signed     WRN/MEDQ  D:  06/06/2005  T:  06/06/2005  Job:  [id number]   cc:   Leonie Man, M.D.  [address]  [address]  [place]  [place] [address]

## 2010-07-16 NOTE — Assessment & Plan Note (Signed)
[facility]                             PULMONARY OFFICE NOTE   NAME:Coffield, EILISH MCDANIEL                   MRN:          [id number]  DATE:02/17/2006                            DOB:          1946-01-11    PULMONARY/SLEEP CONSULT:   PROBLEM:  The patient is a 65 year old woman, seen on kind referral in  sleep medicine consultation by Dr. Jennette Kettle, complaining of inability to  sleep through the night.   HISTORY:  Starting after total hysterectomy in April of this year, she  has found herself unable to sleep through the night, unless she takes  Ambien-CR.  Previously, she was always a night owl, but once asleep, she  slept well.  She would like to be able to sleep now, without medication.  She feels that she can fall asleep eventually, but not maintain sleep.  She has been put on Estratest.  Bed time now is around 11:30 to 12:30,  which is unchanged.  She used to expect five to five and a half hours'  sleep, but never achieves that much any longer before waking at 7:30  a.m. on week days, 8:30 to 9 on weekends.  Either Ambien or Halcion will  help sleep, but the best she can get now is about four and a half hours.  If anything, she is getting a little bit better with time.  She denies  specific physical discomforts.  She sleeps alone, is aware that she  snores and denies daytime sleepiness.  Sometimes she will wake from a  dream, able to hear traffic, but unable to move, in a pattern consistent  with sleep paralysis.  She is occasionally aware that her legs jerk, but  she does not feel restless.   MEDICATIONS:  1. Ambien-CR 12.5 mg.  2. Estratest.  3. Occasional over-the-counter sinus medication.   We have discussed stimulant meds, including decongestants.   ALLERGIES:  No medication allergy.   REVIEW OF SYSTEMS:  Snoring, difficulty initiating sleep, difficulty  maintaining sleep without a sleep aid.  Leg and body jerks.  Dozing in  the evening.   Vivid dreams that are frightening to her, sometimes  associated with sleep paralysis, where she says that she can hear  traffic outside, but cannot move.  No cataplexy, no hallucination.  Her  weight has been stable.   PAST HISTORY:  No history of thyroid disease or any ear, nose or throat  surgery.  Some previous allergy and nasal stuffiness.  Surgeries for  gallbladder, hysterectomy, tubal ligation, breast reduction, tendon  release, exploratory laparotomy with lysis of adhesions, ectopic  pregnancy, knee surgery.   SOCIAL HISTORY:  No tobacco.  Occasional social alcohol.  She cut back  caffeine to one cup in the morning and stopped soft drinks.  She is  single, no children.   FAMILY HISTORY:  Father had emphysema, mother had heart disease, nobody  known to have sleep problems.   OBJECTIVE:  Weight 214 pounds, BP 128/78, pulse regular at 79, room air  saturation 98%.  This is an obese, calm woman.  Palms are wet.  No  tremor.  No obvious rash or adenopathy.  Chest quiet, clear, unlabored  breathing.  Heart sounds regular, without murmur or gallop.  Palate  length 3/4.  No nasal obstruction.  No postnasal drainage.  There is no  thyromegaly or neck vein distention.  Voice quality is normal.  Affect  is not unusual.  There is no restlessness or tremor.   IMPRESSION:  Insomnia with difficulty initiating and maintaining sleep.  The key is that this began after hysterectomy and may represent a  hormonal transition change.  Sleep apnea and periodic limb movement in  sleep are not excluded.  Currently, I favor benign sleep paralysis,  rather than narcolepsy, and benign hypnic jerks, rather than restless  legs, but we will see what the sleep study looks like.   PLAN:  1. We are scheduling a split protocol nocturnal polysomnogram at the      [facility].  2. I have permitted continued use of Ambien-CR 12.5 mg until she      returns.     Clinton D. Maple Hudson, MD,  Tonny Bollman, FACP  Electronically Signed    CDY/MedQ  DD: 02/22/2006  DT: 02/22/2006  Job #: [id number]   cc:   Varney Baas, Dr.  Bryan Lemma. Manus Gunning, M.D.

## 2010-07-16 NOTE — Procedures (Signed)
Ashley Knapp, Ashley Knapp            ACCOUNT NO.:  [id number]   MEDICAL RECORD NO.:  [id number]          PATIENT TYPE:  OUT   LOCATION:  SLEEP CENTER                 FACILITY:  [facility]   PHYSICIAN:  Clinton D. Maple Hudson, MD, FCCP, FACPDATE OF BIRTH:  08-Nov-1947   DATE OF STUDY:  02/24/2006                            NOCTURNAL POLYSOMNOGRAM   INDICATION FOR STUDY:  Hypersomnia with sleep apnea.   EPWORTH SLEEPINESS SCORE:  5/14. BMI 34. Weight 212 pounds.   MEDICATIONS:  Ambien CR and estrogen.   SLEEP ARCHITECTURE:  Total sleep time 315 minutes with sleep efficiency  75%. Stage I with 3%, stage 2 79%, stages 3 and 4 absent. REM 18% of  total sleep time. Sleep latency 22 minutes, REM latency 84 minutes.  Awake after sleep onset 83 minutes, arousal index 3.2. No bedtime  medication was taken.   RESPIRATORY DATA:  Apnea hypopnea index (AHI, RDI) 9.3 obstructive  events per hour indicating mild obstructive sleep apnea/hypopnea  syndrome. This was reflecting a total of 49 hypopnea's. All events were  recorded while the patient was sleeping supine indicating a strong  positional effect. REM AHI 34.7 per hour. She did not qualify for CPAP  titration by split protocol on this study night because of insufficient  events distributed late in the study night.   OXYGEN DATA:  Mild to mod snoring with oxygen desaturation to a nadir of  85%. Mean oxygen saturation through the study was 95% on room air.   CARDIAC DATA:  Normal sinus rhythm.   MOVEMENT-PARASOMNIA:  Insignificant limb jerks. No bathroom visits.   IMPRESSIONS-RECOMMENDATIONS:  1. Mild obstructive sleep apnea/hypopnea syndrome. AHI 9.3 per hour      with strongly positional events limited to supine sleep positions      suggesting that sleep while flat on his back may be therapeutic.      Mild to moderate snoring with oxygen desaturation to a nadir of      85%.      Clinton D. Maple Hudson, MD, [facility], FACP  Diplomate, Biomedical engineer of  Sleep Medicine  Electronically Signed    CDY/MEDQ  D:  03/05/2006 12:41:48  T:  03/05/2006 21:55:21  Job:  [id number]

## 2010-11-24 ENCOUNTER — Other Ambulatory Visit: Payer: Self-pay | Admitting: Gastroenterology

## 2010-11-29 ENCOUNTER — Other Ambulatory Visit: Payer: Self-pay | Admitting: Obstetrics & Gynecology

## 2010-11-29 DIAGNOSIS — Z1231 Encounter for screening mammogram for malignant neoplasm of breast: Secondary | ICD-10-CM

## 2011-01-10 ENCOUNTER — Ambulatory Visit
Admission: RE | Admit: 2011-01-10 | Discharge: 2011-01-10 | Disposition: A | Discharge status: Home / Self Care | Payer: No Typology Code available for payment source | Source: Ambulatory Visit | Attending: Obstetrics & Gynecology | Admitting: Obstetrics & Gynecology

## 2011-01-10 DIAGNOSIS — Z1231 Encounter for screening mammogram for malignant neoplasm of breast: Secondary | ICD-10-CM

## 2011-07-26 ENCOUNTER — Other Ambulatory Visit: Payer: Self-pay | Admitting: Family Medicine

## 2011-07-26 ENCOUNTER — Ambulatory Visit
Admission: RE | Admit: 2011-07-26 | Discharge: 2011-07-26 | Disposition: A | Discharge status: Home / Self Care | Payer: No Typology Code available for payment source | Source: Ambulatory Visit | Attending: Family Medicine | Admitting: Family Medicine

## 2011-07-26 DIAGNOSIS — M25551 Pain in right hip: Secondary | ICD-10-CM

## 2011-11-28 ENCOUNTER — Other Ambulatory Visit: Payer: Self-pay | Admitting: Obstetrics & Gynecology

## 2011-11-28 DIAGNOSIS — Z1231 Encounter for screening mammogram for malignant neoplasm of breast: Secondary | ICD-10-CM

## 2012-01-16 ENCOUNTER — Ambulatory Visit
Admission: RE | Admit: 2012-01-16 | Discharge: 2012-01-16 | Disposition: A | Discharge status: Home / Self Care | Payer: No Typology Code available for payment source | Source: Ambulatory Visit | Attending: Obstetrics & Gynecology | Admitting: Obstetrics & Gynecology

## 2012-01-16 DIAGNOSIS — Z1231 Encounter for screening mammogram for malignant neoplasm of breast: Secondary | ICD-10-CM

## 2012-06-28 ENCOUNTER — Other Ambulatory Visit: Payer: Self-pay | Admitting: Family Medicine

## 2012-06-28 DIAGNOSIS — M25511 Pain in right shoulder: Secondary | ICD-10-CM

## 2012-07-03 ENCOUNTER — Ambulatory Visit
Admission: RE | Admit: 2012-07-03 | Discharge: 2012-07-03 | Disposition: A | Discharge status: Home / Self Care | Payer: No Typology Code available for payment source | Source: Ambulatory Visit | Attending: Family Medicine | Admitting: Family Medicine

## 2012-07-03 DIAGNOSIS — M25511 Pain in right shoulder: Secondary | ICD-10-CM

## 2012-07-05 ENCOUNTER — Other Ambulatory Visit: Payer: Self-pay | Admitting: Orthopedic Surgery

## 2012-07-05 ENCOUNTER — Ambulatory Visit
Admission: RE | Admit: 2012-07-05 | Discharge: 2012-07-05 | Disposition: A | Discharge status: Home / Self Care | Payer: No Typology Code available for payment source | Source: Ambulatory Visit | Attending: Orthopedic Surgery | Admitting: Orthopedic Surgery

## 2012-07-05 DIAGNOSIS — M7541 Impingement syndrome of right shoulder: Secondary | ICD-10-CM

## 2012-11-27 ENCOUNTER — Other Ambulatory Visit: Payer: Self-pay

## 2012-11-27 DIAGNOSIS — Z1231 Encounter for screening mammogram for malignant neoplasm of breast: Secondary | ICD-10-CM

## 2012-12-17 ENCOUNTER — Other Ambulatory Visit: Payer: Self-pay | Admitting: Gastroenterology

## 2013-01-01 ENCOUNTER — Ambulatory Visit
Admission: RE | Admit: 2013-01-01 | Discharge: 2013-01-01 | Disposition: A | Discharge status: Home / Self Care | Payer: PRIVATE HEALTH INSURANCE | Source: Ambulatory Visit | Attending: Physician Assistant | Admitting: Physician Assistant

## 2013-01-01 ENCOUNTER — Other Ambulatory Visit: Payer: Self-pay | Admitting: Physician Assistant

## 2013-01-01 DIAGNOSIS — R52 Pain, unspecified: Secondary | ICD-10-CM

## 2013-01-21 ENCOUNTER — Ambulatory Visit
Admission: RE | Admit: 2013-01-21 | Discharge: 2013-01-21 | Disposition: A | Discharge status: Home / Self Care | Payer: PRIVATE HEALTH INSURANCE | Source: Ambulatory Visit

## 2013-01-21 DIAGNOSIS — Z1231 Encounter for screening mammogram for malignant neoplasm of breast: Secondary | ICD-10-CM

## 2013-03-21 ENCOUNTER — Ambulatory Visit
Admission: RE | Admit: 2013-03-21 | Discharge: 2013-03-21 | Disposition: A | Discharge status: Home / Self Care | Payer: PRIVATE HEALTH INSURANCE | Source: Ambulatory Visit | Attending: Physician Assistant | Admitting: Physician Assistant

## 2013-03-21 ENCOUNTER — Other Ambulatory Visit: Payer: Self-pay | Admitting: Physician Assistant

## 2013-03-21 DIAGNOSIS — R059 Cough, unspecified: Secondary | ICD-10-CM

## 2013-03-21 DIAGNOSIS — R05 Cough: Secondary | ICD-10-CM

## 2013-11-20 ENCOUNTER — Other Ambulatory Visit: Payer: Self-pay

## 2013-11-20 DIAGNOSIS — Z1231 Encounter for screening mammogram for malignant neoplasm of breast: Secondary | ICD-10-CM

## 2014-01-06 ENCOUNTER — Ambulatory Visit: Payer: PRIVATE HEALTH INSURANCE

## 2014-01-20 ENCOUNTER — Ambulatory Visit
Admission: RE | Admit: 2014-01-20 | Discharge: 2014-01-20 | Disposition: A | Discharge status: Home / Self Care | Payer: PRIVATE HEALTH INSURANCE | Source: Ambulatory Visit

## 2014-01-20 DIAGNOSIS — Z1231 Encounter for screening mammogram for malignant neoplasm of breast: Secondary | ICD-10-CM

## 2014-03-11 ENCOUNTER — Other Ambulatory Visit: Payer: Self-pay | Admitting: Family Medicine

## 2014-03-11 ENCOUNTER — Ambulatory Visit
Admission: RE | Admit: 2014-03-11 | Discharge: 2014-03-11 | Disposition: A | Discharge status: Home / Self Care | Payer: PRIVATE HEALTH INSURANCE | Source: Ambulatory Visit | Attending: Family Medicine | Admitting: Family Medicine

## 2014-03-11 DIAGNOSIS — R0981 Nasal congestion: Secondary | ICD-10-CM

## 2014-03-25 ENCOUNTER — Ambulatory Visit (INDEPENDENT_AMBULATORY_CARE_PROVIDER_SITE_OTHER): Payer: PRIVATE HEALTH INSURANCE | Admitting: Podiatry

## 2014-03-25 ENCOUNTER — Encounter: Payer: Self-pay | Admitting: Podiatry

## 2014-03-25 ENCOUNTER — Ambulatory Visit (INDEPENDENT_AMBULATORY_CARE_PROVIDER_SITE_OTHER): Payer: PRIVATE HEALTH INSURANCE

## 2014-03-25 VITALS — BP 137/84 | HR 78 | Resp 16 | Ht 66.0 in | Wt 202.0 lb

## 2014-03-25 DIAGNOSIS — M2010 Hallux valgus (acquired), unspecified foot: Secondary | ICD-10-CM | POA: Diagnosis not present

## 2014-03-25 DIAGNOSIS — M2040 Other hammer toe(s) (acquired), unspecified foot: Secondary | ICD-10-CM

## 2014-03-25 NOTE — Patient Instructions (Signed)
Pre-Operative Instructions  Congratulations, you have decided to take an important step to improving your quality of life.  You can be assured that the doctors of [facility] will be with you every step of the way.  1. Plan to be at the surgery center/hospital at least 1 (one) hour prior to your scheduled time unless otherwise directed by the surgical center/hospital staff.  You must have a responsible adult accompany you, remain during the surgery and drive you home.  Make sure you have directions to the surgical center/hospital and know how to get there on time. 2. For hospital based surgery you will need to obtain a history and physical form from your family physician within 1 month prior to the date of surgery- we will give you a form for you primary physician.  3. We make every effort to accommodate the date you request for surgery.  There are however, times where surgery dates or times have to be moved.  We will contact you as soon as possible if a change in schedule is required.   4. No Aspirin/Ibuprofen for one week before surgery.  If you are on aspirin, any non-steroidal anti-inflammatory medications (Mobic, Aleve, Ibuprofen) you should stop taking it 7 days prior to your surgery.  You make take Tylenol  For pain prior to surgery.  5. Medications- If you are taking daily heart and blood pressure medications, seizure, reflux, allergy, asthma, anxiety, pain or diabetes medications, make sure the surgery center/hospital is aware before the day of surgery so they may notify you which medications to take or avoid the day of surgery. 6. No food or drink after midnight the night before surgery unless directed otherwise by surgical center/hospital staff. 7. No alcoholic beverages 24 hours prior to surgery.  No smoking 24 hours prior to or 24 hours after surgery. 8. Wear loose pants or shorts- loose enough to fit over bandages, boots, and casts. 9. No slip on shoes, sneakers are best. 10. Bring  your boot with you to the surgery center/hospital.  Also bring crutches or a walker if your physician has prescribed it for you.  If you do not have this equipment, it will be provided for you after surgery. 11. If you have not been contracted by the surgery center/hospital by the day before your surgery, call to confirm the date and time of your surgery. 12. Leave-time from work may vary depending on the type of surgery you have.  Appropriate arrangements should be made prior to surgery with your employer. 13. Prescriptions will be provided immediately following surgery by your doctor.  Have these filled as soon as possible after surgery and take the medication as directed. 14. Remove nail polish on the operative foot. 15. Wash the night before surgery.  The night before surgery wash the foot and leg well with the antibacterial soap provided and water paying special attention to beneath the toenails and in between the toes.  Rinse thoroughly with water and dry well with a towel.  Perform this wash unless told not to do so by your physician.  Enclosed: 1 Ice pack (please put in freezer the night before surgery)   1 Hibiclens skin cleaner   Pre-op Instructions  If you have any questions regarding the instructions, do not hesitate to call our office.  [place]: [address] [place], [place] [address] [phone]  [place]: [address] [place], [place] [address] [phone]  [place]: [address]  [place], [place] [address] [phone]  Dr. Richard   Tuchman DPM, Dr. Norman Regal DPM Dr. Richard Sikora DPM, Dr. M. Todd Hyatt DPM, Dr. Kathryn Egerton DPM 

## 2014-03-25 NOTE — Progress Notes (Signed)
   Subjective:    [name], female    DOB: 1945-10-03, 69 y.o.   MRN: [id number]  HPI Comments: Its mainly my right foot , i have bunions and hammertoes . My toes go numb and it has got worse lately over the past 6 months      Review of Systems  All other systems reviewed and are negative.      Objective:   Physical Exam: I have reviewed her past mental history medications allergy surgery social history and review of systems. Pulses are strongly palpable. Neurologic sensorium is intact per Semmes-Weinstein monofilament. Deep tendon reflexes are intact bilateral and muscle strength +5 over 5 dorsiflexion plantar flexors and inverters everters all into the musculature is intact. Orthopedic evaluation of a straight awl joints distal to the ankle for range of motion without crepitation. She does have hallux abductovalgus deformity of the right foot greater than the left with a cocked up medially dislocated second metatarsophalangeal joint and hammertoe deformity. She also has hammertoe deformities to toes #3 and number for the right foot. Flexible pes planus is noted bilateral. Cutaneous evaluation to straight supple well-hydrated cutis no erythema edema cellulitis drainage or odor.        Assessment & Plan:  Assessment: Hallux abductovalgus deformity right. Plantar flexed elongated second metatarsal right. Hammertoe deformity #2 #3 #4 of the right foot. Flat foot deformity bilateral.  Plan: Discussed etiology pathology conservative versus surgical therapies. We went over consent form today line by line number by number giving her ample time to ask questions she saw fit regarding an Austin bunion repair with screw a second metatarsal osteotomy with screw a hammertoe repair #2 #3 and #4 of the right foot with screws or pins. I answered all the questions to the best of my ability in layman's terms regarding these procedures. We did discuss the possible postop complications  which may include but are not limited to postop pain bleeding swelling infection need for further surgery overcorrection and under correction. She was dispensed a cam walker and we will schedule surgery for her in the near future. We did discuss the possible need for orthotics in the future because of her pes planus.

## 2014-04-15 ENCOUNTER — Telehealth: Payer: Self-pay | Admitting: *Deleted

## 2014-04-15 NOTE — Telephone Encounter (Signed)
I attempted to return her call.  I left her a message to call me back. 

## 2014-04-15 NOTE — Telephone Encounter (Signed)
Pt states she would like to set up surgery with Dr. Al CorpusHyatt, but [facility] is not in network.

## 2014-04-16 NOTE — Telephone Encounter (Signed)
I left messages for the patient to give me a call back.  I'm returning your call.

## 2014-04-17 NOTE — Telephone Encounter (Signed)
"  Seems as though we've been playing phone tag."  Yes, I was returning your call in regards to doing surgery at  [facility].  You said your insurance was out of network with them.  I spoke to [place] at [facility] and she said they are in-network with [employer].  "Well I had a co-worker that had surgery there and she said it is out-network.  So, I called and I was told they couldn't find them in the system."  Okay, let me have the surgical center check and see what they can find out and give you a call back.  "Okay, if they are not in network does Dr. Al CorpusHyatt do surgeries anywhere else like [facility]."  He normally does not do surgery anywhere else.  I called and asked Aram BeechamCynthia to research this matter.  "Fax me the insurance and the procedures and we will let you know."  I faxed the information to her.  She called me back and stated, "Her insurance is in-network.  If she has any questions, she can call and [facility] to [facility]."  I will let her know.  I called the patient and informed her I was told her insurance is in-network.  You can call and speak to [facility] with any questions.  You can reach her at [phone].  "Okay I will give her a call and once it's resolved, I will call you back to get this scheduled."

## 2014-05-29 ENCOUNTER — Telehealth: Payer: Self-pay | Admitting: *Deleted

## 2014-05-29 NOTE — Telephone Encounter (Signed)
"  I want to schedule surgery."  I returned her call.  "I'd like to schedule surgery in May."  Okay, Dr. Al CorpusHyatt is pretty open, when would you like to schedule.  " I'd like to do it on 07/04/2014."  Okay, we'll schedule it then.  Just remember not to eat or drink anything after midnight the night before.  Someone will need to go with you to the procedure.  The surgical center will call you a few days before and let you know the exact time to get there.  Did you receive a surgical packet?  "Yes, I need to go on-line and fill out the forms.  Do I go ahead and bring my FMLA forms over to be taken care of?"  Yes, you can bring them over to [place].  "Okay, thank you sorry for the phone tag."

## 2014-06-10 ENCOUNTER — Telehealth: Payer: Self-pay | Admitting: Podiatry

## 2014-06-10 NOTE — Telephone Encounter (Signed)
Patient called stating she wants to cancel her surgery scheduled with Dr. Al CorpusHyatt. I will go ahead and cancel the post-op appointment if it has already been scheduled. JMS

## 2014-06-12 NOTE — Telephone Encounter (Signed)
Ok

## 2014-06-12 NOTE — Telephone Encounter (Signed)
I left a message for patient to give me a call back if she wants to reschedule surgery scheduled for 07/04/2014.  I called surgical center to cancel surgery for 07/04/2014.  I spoke to HarmonyRenee.  "I'll take care of it."

## 2014-07-10 ENCOUNTER — Encounter: Payer: PRIVATE HEALTH INSURANCE | Admitting: Podiatry

## 2014-09-22 ENCOUNTER — Other Ambulatory Visit: Payer: Self-pay | Admitting: Obstetrics & Gynecology

## 2014-09-23 LAB — CYTOLOGY - PAP

## 2014-11-24 ENCOUNTER — Other Ambulatory Visit: Payer: Self-pay

## 2014-11-24 DIAGNOSIS — Z1231 Encounter for screening mammogram for malignant neoplasm of breast: Secondary | ICD-10-CM

## 2015-01-26 ENCOUNTER — Ambulatory Visit
Admission: RE | Admit: 2015-01-26 | Discharge: 2015-01-26 | Disposition: A | Discharge status: Home / Self Care | Payer: PRIVATE HEALTH INSURANCE | Source: Ambulatory Visit

## 2015-01-26 DIAGNOSIS — Z1231 Encounter for screening mammogram for malignant neoplasm of breast: Secondary | ICD-10-CM

## 2015-11-28 ENCOUNTER — Other Ambulatory Visit: Payer: Self-pay | Admitting: Family Medicine

## 2015-11-30 ENCOUNTER — Other Ambulatory Visit: Payer: Self-pay | Admitting: Obstetrics & Gynecology

## 2015-11-30 DIAGNOSIS — Z0000 Encounter for general adult medical examination without abnormal findings: Secondary | ICD-10-CM

## 2016-02-01 ENCOUNTER — Ambulatory Visit
Admission: RE | Admit: 2016-02-01 | Discharge: 2016-02-01 | Disposition: A | Discharge status: Home / Self Care | Payer: PRIVATE HEALTH INSURANCE | Source: Ambulatory Visit | Attending: Obstetrics & Gynecology | Admitting: Obstetrics & Gynecology

## 2016-02-01 DIAGNOSIS — Z0000 Encounter for general adult medical examination without abnormal findings: Secondary | ICD-10-CM

## 2016-08-11 ENCOUNTER — Other Ambulatory Visit (HOSPITAL_COMMUNITY): Payer: Self-pay | Admitting: Diagnostic Radiology

## 2016-08-11 DIAGNOSIS — K7400 Hepatic fibrosis, unspecified: Secondary | ICD-10-CM

## 2016-08-12 ENCOUNTER — Ambulatory Visit
Admission: RE | Admit: 2016-08-12 | Discharge: 2016-08-12 | Disposition: A | Discharge status: Home / Self Care | Payer: PRIVATE HEALTH INSURANCE | Source: Ambulatory Visit | Attending: Diagnostic Radiology | Admitting: Diagnostic Radiology

## 2016-08-12 DIAGNOSIS — K7400 Hepatic fibrosis, unspecified: Secondary | ICD-10-CM

## 2016-11-01 ENCOUNTER — Ambulatory Visit: Payer: PRIVATE HEALTH INSURANCE | Admitting: Allergy and Immunology

## 2016-11-02 ENCOUNTER — Encounter: Payer: Self-pay | Admitting: Allergy and Immunology

## 2016-11-02 ENCOUNTER — Ambulatory Visit (INDEPENDENT_AMBULATORY_CARE_PROVIDER_SITE_OTHER): Payer: PRIVATE HEALTH INSURANCE | Admitting: Allergy and Immunology

## 2016-11-02 VITALS — BP 128/74 | HR 78 | Temp 98.2°F | Resp 18 | Ht 65.0 in | Wt 215.0 lb

## 2016-11-02 DIAGNOSIS — J3089 Other allergic rhinitis: Secondary | ICD-10-CM

## 2016-11-02 DIAGNOSIS — H1045 Other chronic allergic conjunctivitis: Secondary | ICD-10-CM | POA: Diagnosis not present

## 2016-11-02 DIAGNOSIS — H04123 Dry eye syndrome of bilateral lacrimal glands: Secondary | ICD-10-CM | POA: Diagnosis not present

## 2016-11-02 DIAGNOSIS — T7800XD Anaphylactic reaction due to unspecified food, subsequent encounter: Secondary | ICD-10-CM

## 2016-11-02 DIAGNOSIS — H1013 Acute atopic conjunctivitis, bilateral: Secondary | ICD-10-CM

## 2016-11-02 MED ORDER — EPINEPHRINE 0.3 MG/0.3ML IJ SOAJ: 0.3000 mg | Freq: Once | INTRAMUSCULAR | 1 refills | Status: AC

## 2016-11-02 MED ORDER — OLOPATADINE HCL 0.7 % OP SOLN: 1.0000 [drp] | Freq: Every day | OPHTHALMIC | 5 refills | Status: AC

## 2016-11-02 NOTE — Patient Instructions (Addendum)
  1. Allergen avoidance measures  2. Auvi-Q, Benadryl, M.D./ER evaluation for allergic reaction  3. If needed:   A. OTC Systane or similar  B. Pazeo one drop each eye one time per day  4. Try to avoid all oral antihistamines if possible  5. Blood - nut panel, peanut components, strawberry IgE, mustard IgE  6. Further evaluation and treatment? In clinic food challenge?  7. Obtain fall flu vaccine

## 2016-11-02 NOTE — Progress Notes (Signed)
Dear Ashley Knapp,  Thank you for referring Ashley Knapp to the [facility] [place] on 11/02/2016.   Below is a summation of this patient's evaluation and recommendations.  Thank you for your referral. I will keep you informed about this patient's response to treatment.   If you have any questions please do not hesitate to contact me.   Sincerely,  Ashley PriestEric J. Levy Cedano, MD Allergy / Immunology [facility]   ______________________________________________________________________    NEW PATIENT NOTE  Referring Provider: Blair Knapp, Robert, MD Primary Provider: Blair Knapp, Robert, MD Date of office visit: 11/02/2016    Subjective:   Chief Complaint:  Ashley Knapp (DOB: 1945-12-28) is a 71 y.o. female who presents to the clinic on 11/02/2016 with a chief complaint of No chief complaint on file. Marland Kitchen.     HPI: Ashley BoopCharlsea presents to this clinic in evaluation of food allergy. Apparently I had seen her in this clinic over 6 years ago for this issue.  Approximately 6 years ago after eating peanut she developed very significant throat itching and feeling as though her throat was constricting and she has been peanut free since that point in time. Around the same point in time she also appeared to have a similar reaction to almond and she has been almond free for about 5 years. When she eats strawberries she gets itchy skin. When she eats mustard she gets itchy all over. However, she uses mustard to treat leg cramps about every month or every other month and she will just take a Benadryl when she takes her mustard and this apparently prevents itchiness from occurring.  Apparently in July she ate spaghetti sauce and she developed redness of her legs with swelling and itchiness from her ankle to her knee that started about 2 hours after exposure and lasted a few days. There was no other associated systemic or  constitutional symptoms. There was nothing unusual about her spaghetti sauce and she has eaten tomato based food since that point in time without any problem.  She also has a history of developing significant problems with nasal congestion and sneezing and itchy eyes when being exposed to grass. She also has some itchy eyes at nighttime and she has been diagnosed with dry eye syndrome and she uses a wetting solution about 3 times per day and uses olopatadine at nighttime which works relatively well regarding her eye issue.  Past Medical History:  Diagnosis Date  . Allergy   . Lip swelling   . Multiple food allergies   . Urticaria     Past Surgical History:  Procedure Laterality Date  . ABDOMINAL HYSTERECTOMY    . HERNIA REPAIR    . rotator cuff      Allergies as of 11/02/2016      Reactions   Lisinopril Swelling   Lip swelling   Fosamax [alendronate Sodium] Other (See Comments)   Chest pain       Medication List      aspirin 81 MG tablet Take 81 mg by mouth daily.   CALCIUM 1200 PO Take by mouth.   estradiol 2 MG tablet Commonly known as:  ESTRACE Take 2 mg by mouth daily.   hydrochlorothiazide 25 MG tablet Commonly known as:  HYDRODIURIL Take 25 mg by mouth daily.   Magnesium 250 MG Tabs Take by mouth.   Olopatadine HCl 0.7 % Soln Commonly known as:  PAZEO Place 1  drop into both eyes daily.   Vitamin D3 1000 units Caps Take by mouth.   zolpidem 12.5 MG CR tablet Commonly known as:  AMBIEN CR Take 12.5 mg by mouth at bedtime as needed for sleep.         Review of systems negative except as noted in HPI / PMHx or noted below:  Review of Systems  Constitutional: Negative.   HENT: Negative.   Eyes: Negative.   Respiratory: Negative.   Cardiovascular: Negative.   Gastrointestinal: Negative.   Genitourinary: Negative.   Musculoskeletal: Negative.   Skin: Negative.   Neurological: Negative.   Endo/Heme/Allergies: Negative.   Psychiatric/Behavioral:  Negative.     Family History  Problem Relation Age of Onset  . Diabetes Mother   . Hypertension Mother   . Heart disease Mother   . Kidney failure Mother   . Emphysema Father   . Diverticulitis Father     Social History   Social History  . Marital status: Single    Spouse name: N/A  . Number of children: N/A  . Years of education: N/A   Occupational History  . Not on file.   Social History Main Topics  . Smoking status: Never Smoker  . Smokeless tobacco: Never Used  . Alcohol use 0.0 oz/week     Comment: very rare  . Drug use: No  . Sexual activity: Not on file   Other Topics Concern  . Not on file   Social History Narrative  . No narrative on file    Environmental and Social history  Lives in a house with a dry environment, no animals located inside the household, carpeting in the bedroom, no plastic on the bed or pillow, and no smoking ongoing with inside the household.  Objective:   Vitals:   11/02/16 0950  BP: 128/74  Pulse: 78  Resp: 18  Temp: 98.2 F (36.8 C)  SpO2: 98%   Height: 5\' 5"  (165.1 cm) Weight: 215 lb (97.5 kg)  Physical Exam  Constitutional: She is well-developed, well-nourished, and in no distress.  HENT:  Head: Normocephalic. Head is without right periorbital erythema and without left periorbital erythema.  Right Ear: Tympanic membrane, external ear and ear canal normal.  Left Ear: Tympanic membrane, external ear and ear canal normal.  Nose: Nose normal. No mucosal edema or rhinorrhea.  Mouth/Throat: Uvula is midline, oropharynx is clear and moist and mucous membranes are normal. No oropharyngeal exudate.  Eyes: Pupils are equal, round, and reactive to light. Conjunctivae and lids are normal.  Neck: Trachea normal. No tracheal tenderness present. No tracheal deviation present. No thyromegaly present.  Cardiovascular: Normal rate, regular rhythm, S1 normal, S2 normal and normal heart sounds.   No murmur heard. Pulmonary/Chest:  Effort normal and breath sounds normal. No stridor. No tachypnea. No respiratory distress. She has no wheezes. She has no rales. She exhibits no tenderness.  Abdominal: Soft. She exhibits no distension and no mass. There is no hepatosplenomegaly. There is no tenderness. There is no rebound and no guarding.  Musculoskeletal: She exhibits no edema or tenderness.  Lymphadenopathy:       Head (right side): No tonsillar adenopathy present.       Head (left side): No tonsillar adenopathy present.    She has no cervical adenopathy.    She has no axillary adenopathy.  Neurological: She is alert. Gait normal.  Skin: No rash noted. She is not diaphoretic. No erythema. No pallor. Nails show no clubbing.  Psychiatric: Mood  and affect normal.    Diagnostics: Allergy skin tests were performed. She demonstrated hypersensitivity to grasses, weeds, trees, molds, and cat.  Assessment and Plan:    1. Anaphylactic shock due to food, subsequent encounter   2. Other allergic rhinitis   3. Perennial allergic conjunctivitis of both eyes   4. Dry eye syndrome of both eyes     1. Allergen avoidance measures  2. Auvi-Q, Benadryl, M.D./ER evaluation for allergic reaction  3. If needed:   A. OTC Systane or similar  B. Pazeo one drop each eye one time per day  4. Try to avoid all oral antihistamines if possible  5. Blood - nut panel, peanut components, strawberry IgE, mustard IgE  6. Further evaluation and treatment? In clinic food challenge?  7. Obtain fall flu vaccine  Kristel appears to have some form of immunological hyperreactivity that may be tied up with food consumption and I'm going to have her undergo further evaluation with the blood tests noted above in investigation of this issue. Certainly she has an atopic immune system based upon her skin test results. Because of her dry eye syndrome it would be best if she did not use any oral antihistamines and she can continue to use wetting solution  for her eyes and olopatadine if needed. I will contact her with the results of her blood tests once they're available for review and make a decision about how to proceed based upon the results of those tests.  Ashley Priest, MD Allergy / Immunology [facility] [place]

## 2016-11-04 LAB — ALLERGENS(7)
Brazil Nut IgE: <0.1 kU/L
F020-IgE Almond: <0.1 kU/L
F202-IgE Cashew Nut: <0.1 kU/L
Hazelnut (Filbert) IgE: <0.1 kU/L
Peanut IgE: <0.1 kU/L
Pecan Nut IgE: <0.1 kU/L
Walnut IgE: <0.1 kU/L

## 2016-11-04 LAB — ALLERGEN, PEANUT COMPONENT PANEL
F352-IgE Ara h 8: <0.1 kU/L
F422-IgE Ara h 1: <0.1 kU/L
F423-IgE Ara h 2: <0.1 kU/L
F424-IgE Ara h 3: <0.1 kU/L
F427-IgE Ara h 9: <0.1 kU/L

## 2016-11-04 LAB — ALLERGEN, MUSTARD, F89

## 2016-11-04 LAB — ALLERGEN, STRAWBERRY, F44: Allergen Strawberry IgE: <0.1 kU/L

## 2016-11-07 ENCOUNTER — Telehealth: Payer: Self-pay | Admitting: *Deleted

## 2016-11-07 MED ORDER — EPINEPHRINE 0.3 MG/0.3ML IJ SOAJ: 0.3000 mg | Freq: Once | INTRAMUSCULAR | 0 refills | Status: AC

## 2016-11-07 NOTE — Telephone Encounter (Signed)
Yes we can start with peanut challenge in the clinic.

## 2016-11-07 NOTE — Telephone Encounter (Signed)
Scheduled peanut challenge for Tuesday Oct 16 @ 9 am.

## 2016-11-07 NOTE — Telephone Encounter (Signed)
Informed patient of results. On take home sheet you stated in office challenge do you want to schedule??? Please advise.

## 2016-11-07 NOTE — Telephone Encounter (Signed)
-----   Message from Jessica PriestEric J Kozlow, MD sent at 11/07/2016  7:37 AM EDT ----- Please inform patient that all blood tests normal. Will discuss with RV.

## 2016-11-18 ENCOUNTER — Telehealth: Payer: Self-pay | Admitting: Allergy and Immunology

## 2016-11-18 NOTE — Telephone Encounter (Signed)
Pt is schedule for a peanut challenge on 12/13/2016 and has not receive in information about it but was told only to 2 hours and I told her it takes a while. So please sent her information on it.

## 2016-11-21 NOTE — Telephone Encounter (Signed)
I called patient and went over food challenge protocol. I am mailing out the protocol for the food challenges.

## 2016-11-30 ENCOUNTER — Other Ambulatory Visit: Payer: Self-pay | Admitting: Obstetrics & Gynecology

## 2016-11-30 DIAGNOSIS — Z1231 Encounter for screening mammogram for malignant neoplasm of breast: Secondary | ICD-10-CM

## 2016-12-13 ENCOUNTER — Ambulatory Visit (INDEPENDENT_AMBULATORY_CARE_PROVIDER_SITE_OTHER): Payer: PRIVATE HEALTH INSURANCE | Admitting: Allergy and Immunology

## 2016-12-13 ENCOUNTER — Encounter: Payer: Self-pay | Admitting: Allergy and Immunology

## 2016-12-13 VITALS — BP 122/78 | HR 88 | Resp 16

## 2016-12-13 DIAGNOSIS — T7800XD Anaphylactic reaction due to unspecified food, subsequent encounter: Secondary | ICD-10-CM | POA: Diagnosis not present

## 2016-12-13 NOTE — Progress Notes (Signed)
Ashley Knapp returns to this clinic to have a peanut challenge performed. Using in incremental challenge protocol she was fed approximately 3 teaspoons of peanut butter over the course of 3 hours and at no point in time did she ever demonstrate any significant hypersensitivity against this exposure. She can now eat peanuts ad lib.

## 2017-02-06 ENCOUNTER — Ambulatory Visit: Payer: PRIVATE HEALTH INSURANCE

## 2017-03-13 ENCOUNTER — Ambulatory Visit
Admission: RE | Admit: 2017-03-13 | Discharge: 2017-03-13 | Disposition: A | Discharge status: Home / Self Care | Payer: PRIVATE HEALTH INSURANCE | Source: Ambulatory Visit | Attending: Obstetrics & Gynecology | Admitting: Obstetrics & Gynecology

## 2017-03-13 DIAGNOSIS — Z1231 Encounter for screening mammogram for malignant neoplasm of breast: Secondary | ICD-10-CM

## 2017-03-15 ENCOUNTER — Other Ambulatory Visit: Payer: Self-pay | Admitting: Obstetrics & Gynecology

## 2017-03-15 DIAGNOSIS — R928 Other abnormal and inconclusive findings on diagnostic imaging of breast: Secondary | ICD-10-CM

## 2017-03-20 ENCOUNTER — Ambulatory Visit
Admission: RE | Admit: 2017-03-20 | Discharge: 2017-03-20 | Disposition: A | Discharge status: Home / Self Care | Payer: PRIVATE HEALTH INSURANCE | Source: Ambulatory Visit | Attending: Obstetrics & Gynecology | Admitting: Obstetrics & Gynecology

## 2017-03-20 DIAGNOSIS — R928 Other abnormal and inconclusive findings on diagnostic imaging of breast: Secondary | ICD-10-CM

## 2017-05-18 ENCOUNTER — Ambulatory Visit
Admission: RE | Admit: 2017-05-18 | Discharge: 2017-05-18 | Disposition: A | Discharge status: Home / Self Care | Payer: PRIVATE HEALTH INSURANCE | Source: Ambulatory Visit | Attending: Family Medicine | Admitting: Family Medicine

## 2017-05-18 ENCOUNTER — Other Ambulatory Visit: Payer: Self-pay | Admitting: Family Medicine

## 2017-05-18 DIAGNOSIS — M25551 Pain in right hip: Secondary | ICD-10-CM

## 2017-05-18 DIAGNOSIS — M25562 Pain in left knee: Secondary | ICD-10-CM

## 2017-05-18 DIAGNOSIS — M25561 Pain in right knee: Secondary | ICD-10-CM

## 2017-05-30 ENCOUNTER — Ambulatory Visit: Payer: PRIVATE HEALTH INSURANCE | Admitting: Podiatry

## 2017-05-30 ENCOUNTER — Encounter: Payer: Self-pay | Admitting: Podiatry

## 2017-05-30 ENCOUNTER — Ambulatory Visit (INDEPENDENT_AMBULATORY_CARE_PROVIDER_SITE_OTHER): Payer: PRIVATE HEALTH INSURANCE

## 2017-05-30 DIAGNOSIS — M2011 Hallux valgus (acquired), right foot: Secondary | ICD-10-CM | POA: Diagnosis not present

## 2017-05-30 DIAGNOSIS — M2010 Hallux valgus (acquired), unspecified foot: Secondary | ICD-10-CM

## 2017-05-30 DIAGNOSIS — M2041 Other hammer toe(s) (acquired), right foot: Secondary | ICD-10-CM | POA: Diagnosis not present

## 2017-05-30 DIAGNOSIS — M216X1 Other acquired deformities of right foot: Secondary | ICD-10-CM | POA: Diagnosis not present

## 2017-05-30 NOTE — Patient Instructions (Signed)
Pre-Operative Instructions  Congratulations, you have decided to take an important step towards improving your quality of life.  You can be assured that the doctors and staff at [facility] will be with you every step of the way.  Here are some important things you should know:  1. Plan to be at the surgery center/hospital at least 1 (one) hour prior to your scheduled time, unless otherwise directed by the surgical center/hospital staff.  You must have a responsible adult accompany you, remain during the surgery and drive you home.  Make sure you have directions to the surgical center/hospital to ensure you arrive on time. 2. If you are having surgery at [facility] or [facility], you will need a copy of your medical history and physical form from your family physician within one month prior to the date of surgery. We will give you a form for your primary physician to complete.  3. We make every effort to accommodate the date you request for surgery.  However, there are times where surgery dates or times have to be moved.  We will contact you as soon as possible if a change in schedule is required.   4. No aspirin/ibuprofen for one week before surgery.  If you are on aspirin, any non-steroidal anti-inflammatory medications (Mobic, Aleve, Ibuprofen) should not be taken seven (7) days prior to your surgery.  You make take Tylenol for pain prior to surgery.  5. Medications - If you are taking daily heart and blood pressure medications, seizure, reflux, allergy, asthma, anxiety, pain or diabetes medications, make sure you notify the surgery center/hospital before the day of surgery so they can tell you which medications you should take or avoid the day of surgery. 6. No food or drink after midnight the night before surgery unless directed otherwise by surgical center/hospital staff. 7. No alcoholic beverages 24-hours prior to surgery.  No smoking 24-hours prior or 24-hours after  surgery. 8. Wear loose pants or shorts. They should be loose enough to fit over bandages, boots, and casts. 9. Don't wear slip-on shoes. Sneakers are preferred. 10. Bring your boot with you to the surgery center/hospital.  Also bring crutches or a walker if your physician has prescribed it for you.  If you do not have this equipment, it will be provided for you after surgery. 11. If you have not been contacted by the surgery center/hospital by the day before your surgery, call to confirm the date and time of your surgery. 12. Leave-time from work may vary depending on the type of surgery you have.  Appropriate arrangements should be made prior to surgery with your employer. 13. Prescriptions will be provided immediately following surgery by your doctor.  Fill these as soon as possible after surgery and take the medication as directed. Pain medications will not be refilled on weekends and must be approved by the doctor. 14. Remove nail polish on the operative foot and avoid getting pedicures prior to surgery. 15. Wash the night before surgery.  The night before surgery wash the foot and leg well with water and the antibacterial soap provided. Be sure to pay special attention to beneath the toenails and in between the toes.  Wash for at least three (3) minutes. Rinse thoroughly with water and dry well with a towel.  Perform this wash unless told not to do so by your physician.  Enclosed: 1 Ice pack (please put in freezer the night before surgery)   1 Hibiclens skin cleaner     Pre-op instructions  If you have any questions regarding the instructions, please do not hesitate to call our office.  [place]: [address], , [place] [address] -- [phone]  [place]: [address] [place], [place] [address] -- [phone]  [place]: [address]  [place], [place] [address] -- [phone]  [place]: [address], [address], [place], [place] [address] -- [phone]  Website:  [email or web address] 

## 2017-05-30 NOTE — Progress Notes (Signed)
She presents today for a revisit of her surgery consult.  She was consulted for bunionectomy on the right side second and third met osteotomies and hammertoe repairs.  She would like to go over that again today.  Objective: Vital signs are stable she is alert and oriented x3 pulses are palpable.  Neurologic sensorium is intact deep tendon reflexes are intact muscle strength is unchanged symmetrical bilateral.  Orthopedic evaluation demonstrates all joints distal ankle full range of motion without crepitation she has hallux abductovalgus deformity of the right foot with elongated second and third metatarsals visible on radiograph hammertoe deformity is flexible in nature #2 #3 #4 of the right foot.  Ingrown toenail tibial border hallux left  Radiographs demonstrating increase in the first intermetatarsal angle hallux abductus angle greater than normal value with a parabola of a +5 right second and third metatarsals.  Assessment: Pain in limb secondary to hallux abductovalgus deformity hammertoe deformities Lanter flexed elongated second and third metatarsals.  She also has an ingrown toenail tibial border hallux left.  Plan: Discussed etiology pathology conservative versus surgical therapies.  At this point I went ahead and consented her once again for her first second and third met osteotomies with screws hammertoe repairs #2 #3 #4 with screws and/or pins.  Matrixectomy tibial border hallux left.  We discussed all the possible postop complications which may include but are not limited to postop pain bleeding swelling infection recurrence need for further surgery loss of digit loss of limb loss of life she signed all 3 page of the consent form and I will follow-up with her in the near future.

## 2017-05-31 ENCOUNTER — Ambulatory Visit
Admission: RE | Admit: 2017-05-31 | Discharge: 2017-05-31 | Disposition: A | Discharge status: Home / Self Care | Payer: PRIVATE HEALTH INSURANCE | Source: Ambulatory Visit | Attending: Family Medicine | Admitting: Family Medicine

## 2017-05-31 ENCOUNTER — Other Ambulatory Visit: Payer: Self-pay | Admitting: Family Medicine

## 2017-05-31 ENCOUNTER — Telehealth: Payer: Self-pay | Admitting: *Deleted

## 2017-05-31 DIAGNOSIS — M25552 Pain in left hip: Secondary | ICD-10-CM

## 2017-05-31 NOTE — Telephone Encounter (Signed)
"  I saw Dr. Al CorpusHyatt yesterday.  I am calling to schedule my surgery.  I would like to do it on his next available date."  He can do it on May 10.  "That date will be fine."  I will get it scheduled.  Someone from the surgical center will call you a day or two prior to your surgery date.  They will give you your arrival time.  You can register on-line with the surgical center now.  The instructions are in the brochure that was given to you.

## 2017-06-13 ENCOUNTER — Other Ambulatory Visit: Payer: Self-pay | Admitting: Orthopedic Surgery

## 2017-06-13 ENCOUNTER — Ambulatory Visit
Admission: RE | Admit: 2017-06-13 | Discharge: 2017-06-13 | Disposition: A | Discharge status: Home / Self Care | Payer: PRIVATE HEALTH INSURANCE | Source: Ambulatory Visit | Attending: Orthopedic Surgery | Admitting: Orthopedic Surgery

## 2017-06-13 DIAGNOSIS — M7062 Trochanteric bursitis, left hip: Secondary | ICD-10-CM

## 2017-06-13 DIAGNOSIS — M25552 Pain in left hip: Secondary | ICD-10-CM

## 2017-06-20 ENCOUNTER — Telehealth: Payer: Self-pay | Admitting: *Deleted

## 2017-06-20 ENCOUNTER — Other Ambulatory Visit: Payer: Self-pay | Admitting: Orthopedic Surgery

## 2017-06-20 DIAGNOSIS — M25552 Pain in left hip: Secondary | ICD-10-CM

## 2017-06-20 NOTE — Telephone Encounter (Signed)
Pt states she has been having trouble with her hip and would like to know if she could get an injection under fluroscope, prior to her surgery with Dr. Al CorpusHyatt.

## 2017-06-21 NOTE — Telephone Encounter (Signed)
No, we cannot do a hip injection at the same time. She will need to see orthopedics or sports medicine for this.

## 2017-06-21 NOTE — Telephone Encounter (Signed)
She can get an injection this week in her hip if she wishes. Should not interfere with the surgery next week. Thanks.

## 2017-06-21 NOTE — Telephone Encounter (Signed)
I am returning your call.  I spoke to Dr. Ardelle AntonWagoner about you getting a cortisone shot in your hip.  He said it would be fine to get it as long as it's not the day before your surgery.  "Okay good, and you said you spoke to Dr. Ardelle AntonWagoner?"  Yes, that is correct.  Dr. Al CorpusHyatt is not in the office this week.  "Thank you so much for letting me know."

## 2017-06-27 ENCOUNTER — Telehealth: Payer: Self-pay | Admitting: *Deleted

## 2017-06-27 ENCOUNTER — Ambulatory Visit
Admission: RE | Admit: 2017-06-27 | Discharge: 2017-06-27 | Disposition: A | Discharge status: Home / Self Care | Payer: PRIVATE HEALTH INSURANCE | Source: Ambulatory Visit | Attending: Orthopedic Surgery | Admitting: Orthopedic Surgery

## 2017-06-27 DIAGNOSIS — M25552 Pain in left hip: Secondary | ICD-10-CM

## 2017-06-27 MED ORDER — IOPAMIDOL (ISOVUE-M 200) INJECTION 41%
1.0000 mL | Freq: Once | INTRAMUSCULAR | Status: AC
Start: 2017-06-27 — End: 2017-06-27
  Administered 2017-06-27: 1 mL via INTRA_ARTICULAR

## 2017-06-27 MED ORDER — METHYLPREDNISOLONE ACETATE 40 MG/ML INJ SUSP (RADIOLOG
120.0000 mg | Freq: Once | INTRAMUSCULAR | Status: AC
Start: 2017-06-27 — End: 2017-06-27
  Administered 2017-06-27: 120 mg via INTRA_ARTICULAR

## 2017-06-27 NOTE — Telephone Encounter (Signed)
"  I still hadn't heard anything from the surgery center.  My surgery is supposed to be on May 10.  I still don't know the time or anything."  You will not hear from the surgical center until a day or two prior to your surgery date.  "Even to let me know how much I will have to pay?"  You can call them.  Their number is on the back of the brochure that was given to you.  "I called that number and they keep transferring me to you.  I don't know why."  You must be calling [facility] if they are transferring you to me.  You need to call [facility]'s number.  Okay, thank you I been calling you all.

## 2017-07-04 ENCOUNTER — Telehealth: Payer: Self-pay | Admitting: Podiatry

## 2017-07-04 DIAGNOSIS — Z9889 Other specified postprocedural states: Secondary | ICD-10-CM

## 2017-07-04 NOTE — Telephone Encounter (Signed)
Left message informing pt that Fleet Contras at the front desk would be completing her handicap form to be picked up in the [place] front office and I had ordered the walker from [facility], and they should be contacting her to discuss coverage and pick up.

## 2017-07-04 NOTE — Telephone Encounter (Signed)
That is fine just write a script for her to get the walker and have check out write a 3 month sticker.

## 2017-07-04 NOTE — Telephone Encounter (Signed)
Emailed message and orders faxed to [facility]. Handicap sticker will be completed by R. Komonski - receptionist.

## 2017-07-04 NOTE — Addendum Note (Signed)
Addended by: Alphia Kava D on: 07/04/2017 04:43 PM   Modules accepted: Orders

## 2017-07-04 NOTE — Telephone Encounter (Signed)
Pt would like to have a walker ordered for her for after procedure. Pt would also like to have temporary handicap placker. I spoke with Val and she asked me to send message to [facility].

## 2017-07-04 NOTE — Telephone Encounter (Signed)
Pt called because she wanted a walker after her procedure. Also pt would like temporary handicap plate.

## 2017-07-05 ENCOUNTER — Telehealth: Payer: Self-pay | Admitting: Podiatry

## 2017-07-05 NOTE — Telephone Encounter (Signed)
Left message on patient's voicemail informing her that handicap placard form is ready to be picked up in [place] office.

## 2017-07-06 ENCOUNTER — Other Ambulatory Visit: Payer: Self-pay | Admitting: Podiatry

## 2017-07-06 MED ORDER — OXYCODONE-ACETAMINOPHEN 10-325 MG PO TABS: 1.0000 | ORAL_TABLET | Freq: Four times a day (QID) | ORAL | 0 refills | Status: AC | PRN

## 2017-07-06 MED ORDER — CEPHALEXIN 500 MG PO CAPS: 500.0000 mg | ORAL_CAPSULE | Freq: Three times a day (TID) | ORAL | 0 refills | Status: DC

## 2017-07-06 MED ORDER — ONDANSETRON HCL 4 MG PO TABS: 4.0000 mg | ORAL_TABLET | Freq: Three times a day (TID) | ORAL | 0 refills | Status: DC | PRN

## 2017-07-07 ENCOUNTER — Encounter: Payer: Self-pay | Admitting: Podiatry

## 2017-07-07 DIAGNOSIS — L600 Ingrowing nail: Secondary | ICD-10-CM | POA: Diagnosis not present

## 2017-07-07 DIAGNOSIS — M2011 Hallux valgus (acquired), right foot: Secondary | ICD-10-CM | POA: Diagnosis not present

## 2017-07-07 DIAGNOSIS — M2041 Other hammer toe(s) (acquired), right foot: Secondary | ICD-10-CM | POA: Diagnosis not present

## 2017-07-07 DIAGNOSIS — M21541 Acquired clubfoot, right foot: Secondary | ICD-10-CM

## 2017-07-13 ENCOUNTER — Telehealth: Payer: Self-pay | Admitting: Podiatry

## 2017-07-13 ENCOUNTER — Ambulatory Visit (INDEPENDENT_AMBULATORY_CARE_PROVIDER_SITE_OTHER): Payer: Self-pay | Admitting: Podiatry

## 2017-07-13 ENCOUNTER — Ambulatory Visit (INDEPENDENT_AMBULATORY_CARE_PROVIDER_SITE_OTHER): Payer: PRIVATE HEALTH INSURANCE

## 2017-07-13 VITALS — BP 121/80 | HR 78 | Temp 97.5°F

## 2017-07-13 DIAGNOSIS — M2010 Hallux valgus (acquired), unspecified foot: Secondary | ICD-10-CM | POA: Diagnosis not present

## 2017-07-13 DIAGNOSIS — M2041 Other hammer toe(s) (acquired), right foot: Secondary | ICD-10-CM

## 2017-07-13 DIAGNOSIS — M2011 Hallux valgus (acquired), right foot: Secondary | ICD-10-CM

## 2017-07-13 MED ORDER — DOXYCYCLINE HYCLATE 100 MG PO TABS: 100.0000 mg | ORAL_TABLET | Freq: Two times a day (BID) | ORAL | 0 refills | Status: DC

## 2017-07-13 NOTE — Telephone Encounter (Signed)
I just left from the office and was given a prescription for an antibiotic. I wanted to know am I to continue the was I was given post-op last Friday, 10 May and then start this new one or does the doctor want me to start the new one now? I'm a pt of Dr. Geryl Rankins but I was seen by Dr. Samuella Cota today. Please call me back at [phone]. Thank you.

## 2017-07-13 NOTE — Telephone Encounter (Signed)
I informed pt Dr. Samuella Cota wanted pt to stop the keflex and begin the Doxycycline. Pt states she did stop back at the office and was informed she was to stop the keflex and begin doxycycline, and thanked me for calling.

## 2017-07-20 ENCOUNTER — Encounter: Payer: Self-pay | Admitting: Podiatry

## 2017-07-20 ENCOUNTER — Ambulatory Visit (INDEPENDENT_AMBULATORY_CARE_PROVIDER_SITE_OTHER): Payer: PRIVATE HEALTH INSURANCE | Admitting: Podiatry

## 2017-07-20 DIAGNOSIS — M2010 Hallux valgus (acquired), unspecified foot: Secondary | ICD-10-CM

## 2017-07-20 DIAGNOSIS — M2011 Hallux valgus (acquired), right foot: Secondary | ICD-10-CM

## 2017-07-20 MED ORDER — HYDROCODONE-ACETAMINOPHEN 5-325 MG PO TABS: 1.0000 | ORAL_TABLET | Freq: Four times a day (QID) | ORAL | 0 refills | Status: DC | PRN

## 2017-07-20 NOTE — Progress Notes (Signed)
She presents today for postop visit date of surgery Jul 07, 2017 status post [facility] bunion repair second metatarsal osteotomy hammertoe repair #2 #3 with an permanent nail excision hallux left.  She is doing quite well no erythema edema cellulitis drainage or odor still good range of motion sutures are intact margins well coapted.  Assessment: Well-healing surgical foot.  Plan: Redressed today dressed a compressive dressing follow-up with me in 1 week for suture removal.

## 2017-07-27 ENCOUNTER — Ambulatory Visit (INDEPENDENT_AMBULATORY_CARE_PROVIDER_SITE_OTHER): Payer: Self-pay | Admitting: Podiatry

## 2017-07-27 ENCOUNTER — Encounter: Payer: Self-pay | Admitting: Podiatry

## 2017-07-27 ENCOUNTER — Ambulatory Visit (INDEPENDENT_AMBULATORY_CARE_PROVIDER_SITE_OTHER): Payer: PRIVATE HEALTH INSURANCE

## 2017-07-27 DIAGNOSIS — M2010 Hallux valgus (acquired), unspecified foot: Secondary | ICD-10-CM

## 2017-07-27 DIAGNOSIS — M2011 Hallux valgus (acquired), right foot: Secondary | ICD-10-CM

## 2017-07-30 NOTE — Progress Notes (Signed)
She presents today for postop visit date of surgery Jul 07, 2017 status post [facility] bunionectomy right metatarsal osteotomy #2 right hammertoe repair #2 #3 #4 right matrixectomy hallux left.  She states that her toes feel fine.  She denies fever chills nausea vomiting muscle aches pains denies calf pain back pain chest pain shortness of breath.  Objective: Vital signs are stable she is alert and oriented x3.  Pulses are palpable.  Dry sterile dressing intact was removed demonstrates mild elevation of the second toe otherwise all the other toes are rectus in good position.  Sutures were removed.  Margins remain well coapted.  Assessment: Well-healing surgical foot mild elevation of the second toe.  Plan: Demonstrated to the patient how to stretch the second toe including massage daily dressings and then nighttime splinting.  Placed her in a compression anklet and a Darco shoe we will follow-up with her in 2 weeks

## 2017-08-10 ENCOUNTER — Encounter: Payer: Self-pay | Admitting: Podiatry

## 2017-08-10 ENCOUNTER — Encounter: Payer: PRIVATE HEALTH INSURANCE | Admitting: Podiatry

## 2017-08-10 ENCOUNTER — Ambulatory Visit (INDEPENDENT_AMBULATORY_CARE_PROVIDER_SITE_OTHER): Payer: PRIVATE HEALTH INSURANCE | Admitting: Podiatry

## 2017-08-10 ENCOUNTER — Ambulatory Visit (INDEPENDENT_AMBULATORY_CARE_PROVIDER_SITE_OTHER): Payer: PRIVATE HEALTH INSURANCE

## 2017-08-10 DIAGNOSIS — Z9889 Other specified postprocedural states: Secondary | ICD-10-CM

## 2017-08-10 DIAGNOSIS — M2011 Hallux valgus (acquired), right foot: Secondary | ICD-10-CM

## 2017-08-10 DIAGNOSIS — M2041 Other hammer toe(s) (acquired), right foot: Secondary | ICD-10-CM

## 2017-08-10 DIAGNOSIS — M216X1 Other acquired deformities of right foot: Secondary | ICD-10-CM

## 2017-08-10 MED ORDER — DOXYCYCLINE HYCLATE 100 MG PO TABS: 100.0000 mg | ORAL_TABLET | Freq: Two times a day (BID) | ORAL | 0 refills | Status: DC

## 2017-08-10 NOTE — Progress Notes (Signed)
She presents today she is status post an [facility] bunionectomy second metatarsal osteotomy with hammertoe repair #2 #3 and #4 of the right foot.  She denies fever chills nausea vomiting muscle aches and pains states that the foot is still sore and swells some.  Objective: Vital signs are stable she is alert and oriented x3 there is no erythema with exception of a small amount surrounding a slow healing wound overlying the second metatarsal incision.  There is no purulence no malodor some fibrin deposition is present no drainage mild cellulitis with hyperpigmentation.  Toes appear to be healing very nicely she has good full range of motion of the first metatarsophalangeal joint.  Assessment: Well-healing surgical foot slow healing wound.  Plan: Encouraged her to keep the second toe pulled down we will start on a course of doxycycline she will continue to soak the toe Epsom salts and warm water I will follow-up with her in 2 weeks.

## 2017-08-21 ENCOUNTER — Telehealth: Payer: Self-pay | Admitting: Podiatry

## 2017-08-21 NOTE — Telephone Encounter (Signed)
I'm a pt of Dr. Geryl RankinsHyatt's and I've been on two and a half rounds of antibiotics. I have I believe a yeast infection now. I was wondering if you could call something in to [facility] for me. In the past, what has worked for me is the Fluconazole 150 mg tablets. You take three within a six day period. I'm not trying to tell them what to do but that is what has worked for me before. I can be reached at [phone]. Thank you.

## 2017-08-21 NOTE — Telephone Encounter (Signed)
Its okay to do that

## 2017-08-22 MED ORDER — FLUCONAZOLE 150 MG PO TABS: ORAL_TABLET | ORAL | 0 refills | Status: DC

## 2017-08-22 NOTE — Addendum Note (Signed)
Addended by: Alphia Kava'CONNELL, VALERY D on: 08/22/2017 10:08 AM   Modules accepted: Orders

## 2017-08-22 NOTE — Telephone Encounter (Signed)
I informed pt of Dr. Hyatt's orders. 

## 2017-08-29 ENCOUNTER — Ambulatory Visit (INDEPENDENT_AMBULATORY_CARE_PROVIDER_SITE_OTHER): Payer: PRIVATE HEALTH INSURANCE

## 2017-08-29 ENCOUNTER — Encounter: Payer: Self-pay | Admitting: Podiatry

## 2017-08-29 ENCOUNTER — Ambulatory Visit (INDEPENDENT_AMBULATORY_CARE_PROVIDER_SITE_OTHER): Payer: PRIVATE HEALTH INSURANCE | Admitting: Podiatry

## 2017-08-29 DIAGNOSIS — Z9889 Other specified postprocedural states: Secondary | ICD-10-CM

## 2017-08-29 DIAGNOSIS — M216X1 Other acquired deformities of right foot: Secondary | ICD-10-CM | POA: Diagnosis not present

## 2017-08-29 DIAGNOSIS — M2041 Other hammer toe(s) (acquired), right foot: Secondary | ICD-10-CM

## 2017-08-29 DIAGNOSIS — M2011 Hallux valgus (acquired), right foot: Secondary | ICD-10-CM

## 2017-08-29 NOTE — Progress Notes (Signed)
She presents today date of surgery 07/07/2017 status post Ashley Knapp bunionectomy right second metatarsal osteotomy hammertoe repair #2 #3 #4 states that is doing okay this feels a little nauseated today.  But my feet are feeling better.  Continues to wear the Darco splint.  Objective: Vital signs are stable she is alert and oriented x3 there is no erythematous mild edema no cellulitis drainage or odor still has some dorsiflexion at the second of the second toe at the level of the metatarsal phalangeal joint.  Otherwise all joints have good range of motion.  Radiographs demonstrate a well-healing surgical osteotomies.  Assessment: Well-healing surgical foot.  Plan: I am allow her to get back into regular shoe gear and I will follow-up with her in about 1 month I will allow her to get back to work next week as long as she can wear her shoes.  And she needs to continue to wear the Darco splint.

## 2017-09-14 ENCOUNTER — Ambulatory Visit (INDEPENDENT_AMBULATORY_CARE_PROVIDER_SITE_OTHER): Payer: PRIVATE HEALTH INSURANCE | Admitting: Podiatry

## 2017-09-14 ENCOUNTER — Ambulatory Visit (INDEPENDENT_AMBULATORY_CARE_PROVIDER_SITE_OTHER): Payer: PRIVATE HEALTH INSURANCE

## 2017-09-14 ENCOUNTER — Encounter: Payer: Self-pay | Admitting: Podiatry

## 2017-09-14 DIAGNOSIS — M2011 Hallux valgus (acquired), right foot: Secondary | ICD-10-CM

## 2017-09-14 DIAGNOSIS — M216X1 Other acquired deformities of right foot: Secondary | ICD-10-CM

## 2017-09-14 DIAGNOSIS — M2041 Other hammer toe(s) (acquired), right foot: Secondary | ICD-10-CM

## 2017-09-14 DIAGNOSIS — Z9889 Other specified postprocedural states: Secondary | ICD-10-CM

## 2017-09-16 NOTE — Progress Notes (Signed)
She presents today for her postop visit date of surgery Jul 07, 2017 status post Eliberto Ivoryustin bunionectomy right second and third metatarsal osteotomies with permanent matrixectomy's.  Hammertoe repair #2 #3 #4 the right foot.  She still is bothered me some since she is been back to work but otherwise she is doing pretty well.  Objective: Vital signs are stable she is alert and oriented x3.  Pulses are palpable.  Surgical foot good range of motion seems to be doing well with a surgical correction.  No open lesions or wounds all incisions have some gone on to heal mild edema no erythema cellulitis drainage or odor noted.  Radiographically appears to be healing very nicely.  Still has some contraction at the second metatarsal phalangeal joint and will continue to wear her digital splint for that.  Assessment well-healing surgical foot.  Plan: Continue to wear digital splint second toe.  Follow-up with me in 6 weeks.

## 2017-09-17 NOTE — Progress Notes (Signed)
  Subjective:  [name], female    DOB: 1945/11/10,  MRN: [id number]  Chief Complaint  Patient presents with  . Routine Post Op    dos 05.10.2019 [place] Bunionectomy Rt; Metatarsal Osteotomy 2,3 Rt; Exc. Nail Perm. Hallux Lt; Hammertoe Repair 2,3,4 Rt, doing well, toe throbs at times    DOS: 07/07/17 Procedure: Right Austin bunionectomy metatarsal osteotomy 2 3 right, excision of nail permanent hallux left.  Hammertoe repair 234 right  72 y.o. female returns for post-op check. Denies N/V/F/Ch. Pain is controlled with current medications.  States the toe on the left foot drop at times  Objective:   General AA&O x3. Normal mood and affect.  Vascular Foot warm and well perfused.  Neurologic Gross sensation intact.  Dermatologic Skin healing well without signs of infection. Skin edges well coapted without signs of infection.  Slight left hallux erythema  Orthopedic: Tenderness to palpation noted about the surgical site.    Assessment & Plan:  Patient was evaluated and treated and all questions answered.  S/p foot surgery bilat -Progressing as expected post-operatively. -Sutures: intact. -Medications refilled: Rx doxycycline for erythema left great toe -Foot redressed.  Return in about 1 week (around 07/20/2017) for hyatt patient.

## 2017-10-26 ENCOUNTER — Ambulatory Visit (INDEPENDENT_AMBULATORY_CARE_PROVIDER_SITE_OTHER): Payer: PRIVATE HEALTH INSURANCE

## 2017-10-26 ENCOUNTER — Encounter: Payer: Self-pay | Admitting: Podiatry

## 2017-10-26 ENCOUNTER — Ambulatory Visit (INDEPENDENT_AMBULATORY_CARE_PROVIDER_SITE_OTHER): Payer: PRIVATE HEALTH INSURANCE | Admitting: Podiatry

## 2017-10-26 DIAGNOSIS — M2011 Hallux valgus (acquired), right foot: Secondary | ICD-10-CM | POA: Diagnosis not present

## 2017-10-26 DIAGNOSIS — M2041 Other hammer toe(s) (acquired), right foot: Secondary | ICD-10-CM | POA: Diagnosis not present

## 2017-10-26 DIAGNOSIS — M216X1 Other acquired deformities of right foot: Secondary | ICD-10-CM

## 2017-10-26 DIAGNOSIS — Z9889 Other specified postprocedural states: Secondary | ICD-10-CM

## 2017-10-26 NOTE — Progress Notes (Signed)
She presents today for follow-up of her Eliberto Ivoryustin bunionectomy second third metatarsal osteotomies hammertoe repair #2 #3 #4 and 5.  She states that seems to be doing okay second toe sits up a little bit but all in all he seems to be doing well.  Objective: Vital signs are stable she is alert and oriented x3.  Pulses are palpable.  There is no erythema cellulitis drainage or odor to some mild edema and a tight scar overlying the second metatarsal phalangeal joint causing contraction of the second toe.  Radiographs demonstrate well-healed osteotomies and fusion sites.  Assessment: Well-healing surgical foot slight dorsiflexion of the second MTPJ.  Plan: Place her in a digital correction device she can wear with her shoe gear.  I will follow-up with her in about 2 months to see if there are any changes.

## 2017-12-28 ENCOUNTER — Encounter: Payer: Self-pay | Admitting: Podiatry

## 2017-12-28 ENCOUNTER — Ambulatory Visit: Payer: PRIVATE HEALTH INSURANCE | Admitting: Podiatry

## 2017-12-28 DIAGNOSIS — Z9889 Other specified postprocedural states: Secondary | ICD-10-CM

## 2017-12-28 DIAGNOSIS — M2011 Hallux valgus (acquired), right foot: Secondary | ICD-10-CM | POA: Diagnosis not present

## 2017-12-28 DIAGNOSIS — M216X1 Other acquired deformities of right foot: Secondary | ICD-10-CM

## 2017-12-28 DIAGNOSIS — M2041 Other hammer toe(s) (acquired), right foot: Secondary | ICD-10-CM | POA: Diagnosis not present

## 2017-12-28 NOTE — Progress Notes (Signed)
Presents today for postop visit date of surgery 07/07/2017 status post Eliberto Ivory bunionectomy right metatarsal osteotomies #2 #3 of the right foot permanent matrixectomy's.  Hammertoe repair left.  Objective: Vital signs are stable alert and oriented x3.  Pulses are palpable.  He still has numbness along the lateral aspect of the foot on palpation.  Otherwise she has good range of motion of the toe she does have some dorsiflexion of the second metatarsal phalangeal joint of the second toe in a rectus position.  There she is continues to wear her toe corrected.  Assessment: Well-healing surgical foot with some residual neuropathy laterally.  Cannot rule out a radiculopathy or dermatomal issue.  Plan: I will follow-up with her in a couple of months just to see if she is improving or worsening.  We may need to consider scar revision in May of next year.

## 2018-01-08 ENCOUNTER — Other Ambulatory Visit: Payer: Self-pay | Admitting: Obstetrics & Gynecology

## 2018-01-08 DIAGNOSIS — Z1231 Encounter for screening mammogram for malignant neoplasm of breast: Secondary | ICD-10-CM

## 2018-03-06 ENCOUNTER — Encounter: Payer: Self-pay | Admitting: Podiatry

## 2018-03-06 ENCOUNTER — Ambulatory Visit (INDEPENDENT_AMBULATORY_CARE_PROVIDER_SITE_OTHER): Payer: PRIVATE HEALTH INSURANCE | Admitting: Podiatry

## 2018-03-06 VITALS — BP 117/65 | HR 79 | Resp 16

## 2018-03-06 DIAGNOSIS — Z9889 Other specified postprocedural states: Secondary | ICD-10-CM

## 2018-03-06 DIAGNOSIS — M216X1 Other acquired deformities of right foot: Secondary | ICD-10-CM

## 2018-03-06 DIAGNOSIS — M2041 Other hammer toe(s) (acquired), right foot: Secondary | ICD-10-CM | POA: Diagnosis not present

## 2018-03-06 DIAGNOSIS — M2011 Hallux valgus (acquired), right foot: Secondary | ICD-10-CM | POA: Diagnosis not present

## 2018-03-07 NOTE — Progress Notes (Signed)
She presents today states that second toe still sticks up a little bit in the fourth and fifth toes are slightly numb but all in all I am very happy with the outcome.  Denies fever chills nausea vomiting muscle aches and pains.  Objective: Vital signs are stable alert and oriented x3.  Pulses are palpable.  She has surgery back in May and this is going to progress quite nicely.  She had great range of motion of the first metatarsal phalangeal joint second toe sticks up just slightly but there are still considerable scar tissue just proximal to the metatarsal phalangeal joint.  Assessment: Well-healing surgical foot.  Plan: On follow-up with her in May just to reevaluate the need for possible release of that joint.

## 2018-03-19 ENCOUNTER — Ambulatory Visit
Admission: RE | Admit: 2018-03-19 | Discharge: 2018-03-19 | Disposition: A | Discharge status: Home / Self Care | Payer: PRIVATE HEALTH INSURANCE | Source: Ambulatory Visit | Attending: Obstetrics & Gynecology | Admitting: Obstetrics & Gynecology

## 2018-03-19 DIAGNOSIS — Z1231 Encounter for screening mammogram for malignant neoplasm of breast: Secondary | ICD-10-CM

## 2018-04-03 ENCOUNTER — Other Ambulatory Visit: Payer: Self-pay | Admitting: Orthopedic Surgery

## 2018-04-03 DIAGNOSIS — M1612 Unilateral primary osteoarthritis, left hip: Secondary | ICD-10-CM

## 2018-04-10 ENCOUNTER — Ambulatory Visit
Admission: RE | Admit: 2018-04-10 | Discharge: 2018-04-10 | Disposition: A | Discharge status: Home / Self Care | Payer: PRIVATE HEALTH INSURANCE | Source: Ambulatory Visit | Attending: Orthopedic Surgery | Admitting: Orthopedic Surgery

## 2018-04-10 DIAGNOSIS — M1612 Unilateral primary osteoarthritis, left hip: Secondary | ICD-10-CM

## 2018-04-10 MED ORDER — METHYLPREDNISOLONE ACETATE 40 MG/ML INJ SUSP (RADIOLOG
120.0000 mg | Freq: Once | INTRAMUSCULAR | Status: AC
  Administered 2018-04-10: 120 mg via INTRA_ARTICULAR

## 2018-04-10 MED ORDER — IOPAMIDOL (ISOVUE-M 200) INJECTION 41%
1.0000 mL | Freq: Once | INTRAMUSCULAR | Status: AC
  Administered 2018-04-10: 1 mL via INTRA_ARTICULAR

## 2018-06-11 MED FILL — OLOPATADINE HCL 0.1 % SOLN: 0.1 | 30 days supply | Qty: 5 | Fill #0

## 2018-06-11 MED FILL — ESTRADIOL 2 MG TABS: 2 | 30 days supply | Qty: 60 | Fill #0

## 2018-06-11 MED FILL — ZOLPIDEM TART ER 12.5 MG TA: 12.5 | 30 days supply | Qty: 30 | Fill #0

## 2018-06-11 MED FILL — HYDROCHLOROTHIAZIDE 25 MG T: 25 | 90 days supply | Qty: 90 | Fill #0

## 2018-07-10 ENCOUNTER — Ambulatory Visit: Payer: PRIVATE HEALTH INSURANCE | Admitting: Podiatry

## 2018-07-12 ENCOUNTER — Encounter: Payer: Self-pay | Admitting: Podiatry

## 2018-07-12 ENCOUNTER — Ambulatory Visit: Payer: PRIVATE HEALTH INSURANCE | Admitting: Podiatry

## 2018-07-12 ENCOUNTER — Other Ambulatory Visit: Payer: Self-pay

## 2018-07-12 VITALS — Temp 97.3°F

## 2018-07-12 DIAGNOSIS — M205X9 Other deformities of toe(s) (acquired), unspecified foot: Secondary | ICD-10-CM | POA: Diagnosis not present

## 2018-07-12 NOTE — Progress Notes (Signed)
She presents today for follow-up of her second toe right foot date of surgery 07/07/2017 status post Eliberto Ivory bunionectomy second metatarsal osteotomy hammertoe repair #2 #3.  She states that she still has some numbness in the toes and the second toe still sticks up a bit.  Objective: Vital signs are stable she is alert and oriented x3-second toe is slightly contracted at the metatarsal phalangeal joint level however radiographs demonstrate well-healed osteotomies with internal fixation in good position.  Assessment: Contracted second metatarsal phalangeal joint left.  Plan: I spoke with her today about performing a minor incident procedure in the office to help release the joint itself.  We will do this on a Thursday afternoon last thing of the day she understands that she will need her small surgical shoe for this and that she really should not have a whole lot of downtime.  She understands that this may not completely work but we need the second toe down to help with opposition of the great toe.

## 2018-07-16 MED FILL — ZOLPIDEM TART ER 12.5 MG TA: 12.5 | 30 days supply | Qty: 30 | Fill #1

## 2018-07-19 ENCOUNTER — Encounter: Payer: Self-pay | Admitting: Podiatry

## 2018-07-19 ENCOUNTER — Other Ambulatory Visit: Payer: Self-pay

## 2018-07-19 ENCOUNTER — Ambulatory Visit: Payer: PRIVATE HEALTH INSURANCE | Admitting: Podiatry

## 2018-07-19 VITALS — Temp 97.1°F

## 2018-07-19 DIAGNOSIS — M205X1 Other deformities of toe(s) (acquired), right foot: Secondary | ICD-10-CM

## 2018-07-19 DIAGNOSIS — M205X9 Other deformities of toe(s) (acquired), unspecified foot: Secondary | ICD-10-CM

## 2018-07-19 NOTE — Progress Notes (Signed)
She presents today for follow-up of her contracted second metatarsal phalangeal joint.  She states that she is ready to have this toe fixed.  She denies fever chills nausea vomiting muscle aches and pains.  Objective: Vital signs are stable she alert and oriented x3 pulses are palpable to the right foot.  At this point she does have contracted second metatarsal phalangeal joint.  Assessment: Contracted second metatarsal phalangeal joint.  Plan: After local anesthetic was administered about the second metatarsal phalangeal joint the area was prepped and draped as normal sterile fashion.  A small transverse incision was made with an 11 blade carried deep with combination of sharp and blunt dissection to the level of the joint capsule and tendon.  The 11 blade was then utilized to transect the joint capsule and the tendon releasing the contracture deformity and the toe remained rectus.  The area was copious lavaged normal sterile saline one single 4 oh stitch was placed in a dry sterile compressive dressing holding the toe in a plantarflexed position was applied.  She was dispensed a Darco shoe we will follow-up with her in 1 to 2 weeks.

## 2018-07-26 ENCOUNTER — Ambulatory Visit: Payer: PRIVATE HEALTH INSURANCE | Admitting: Podiatry

## 2018-07-26 ENCOUNTER — Other Ambulatory Visit: Payer: Self-pay

## 2018-07-26 ENCOUNTER — Encounter: Payer: Self-pay | Admitting: Podiatry

## 2018-07-26 VITALS — Temp 97.3°F

## 2018-07-26 DIAGNOSIS — Z9889 Other specified postprocedural states: Secondary | ICD-10-CM

## 2018-07-26 DIAGNOSIS — M205X9 Other deformities of toe(s) (acquired), unspecified foot: Secondary | ICD-10-CM

## 2018-07-26 MED FILL — ESTRADIOL 2 MG TABS: 2 | 30 days supply | Qty: 60 | Fill #0

## 2018-07-26 NOTE — Progress Notes (Signed)
She presents today for follow-up of her capsulotomy second toe of the right foot.  She states that is doing great is exactly what I wanted to do and I am very happy with the outcome.  She denies fever chills nausea vomiting muscle aches pains calf pain back pain chest pain shortness of breath.  Objective: Dressed her dressing intact was removed demonstrates no erythema mild edema no cellulitis drainage or odor sutures intact margins well coapted.  I went ahead and remove the sutures today margins remain well coapted with no signs of infection.  Assessment: Well-healing surgical capsulotomy tenotomy second toe right foot.  Plan: Follow-up with me on an as-needed basis remove sutures today watch for signs and symptoms of infection if there are any issues notify us immediately otherwise as needed

## 2018-08-15 MED FILL — NITROFURANTOIN MONO-MCR 100: 100 | 7 days supply | Qty: 14 | Fill #0

## 2018-08-15 MED FILL — ZOLPIDEM TART ER 12.5 MG TA: 12.5 | 30 days supply | Qty: 30 | Fill #2

## 2018-08-21 MED FILL — HYDROCHLOROTHIAZIDE 25 MG T: 25 | 30 days supply | Qty: 30 | Fill #0

## 2018-08-23 MED FILL — ESTRADIOL 2 MG TABS: 2 | 30 days supply | Qty: 60 | Fill #1

## 2019-02-18 ENCOUNTER — Other Ambulatory Visit: Payer: Self-pay | Admitting: Obstetrics & Gynecology

## 2019-02-18 DIAGNOSIS — Z1231 Encounter for screening mammogram for malignant neoplasm of breast: Secondary | ICD-10-CM

## 2019-02-28 IMAGING — CR DG HIP (WITH OR WITHOUT PELVIS) 2-3V*L*
2 series · 2 of 2 positions shown · non-contrast
Comparison: Right hip series 05/18/2017. CT Abdomen and Pelvis
03/10/2008.

CLINICAL DATA: 72-year-old female with unexplained increasing left
hip pain for 1 week. Right hip pain last month.

EXAM:
DG HIP (WITH OR WITHOUT PELVIS) 2-3V LEFT

[t hip ap left]
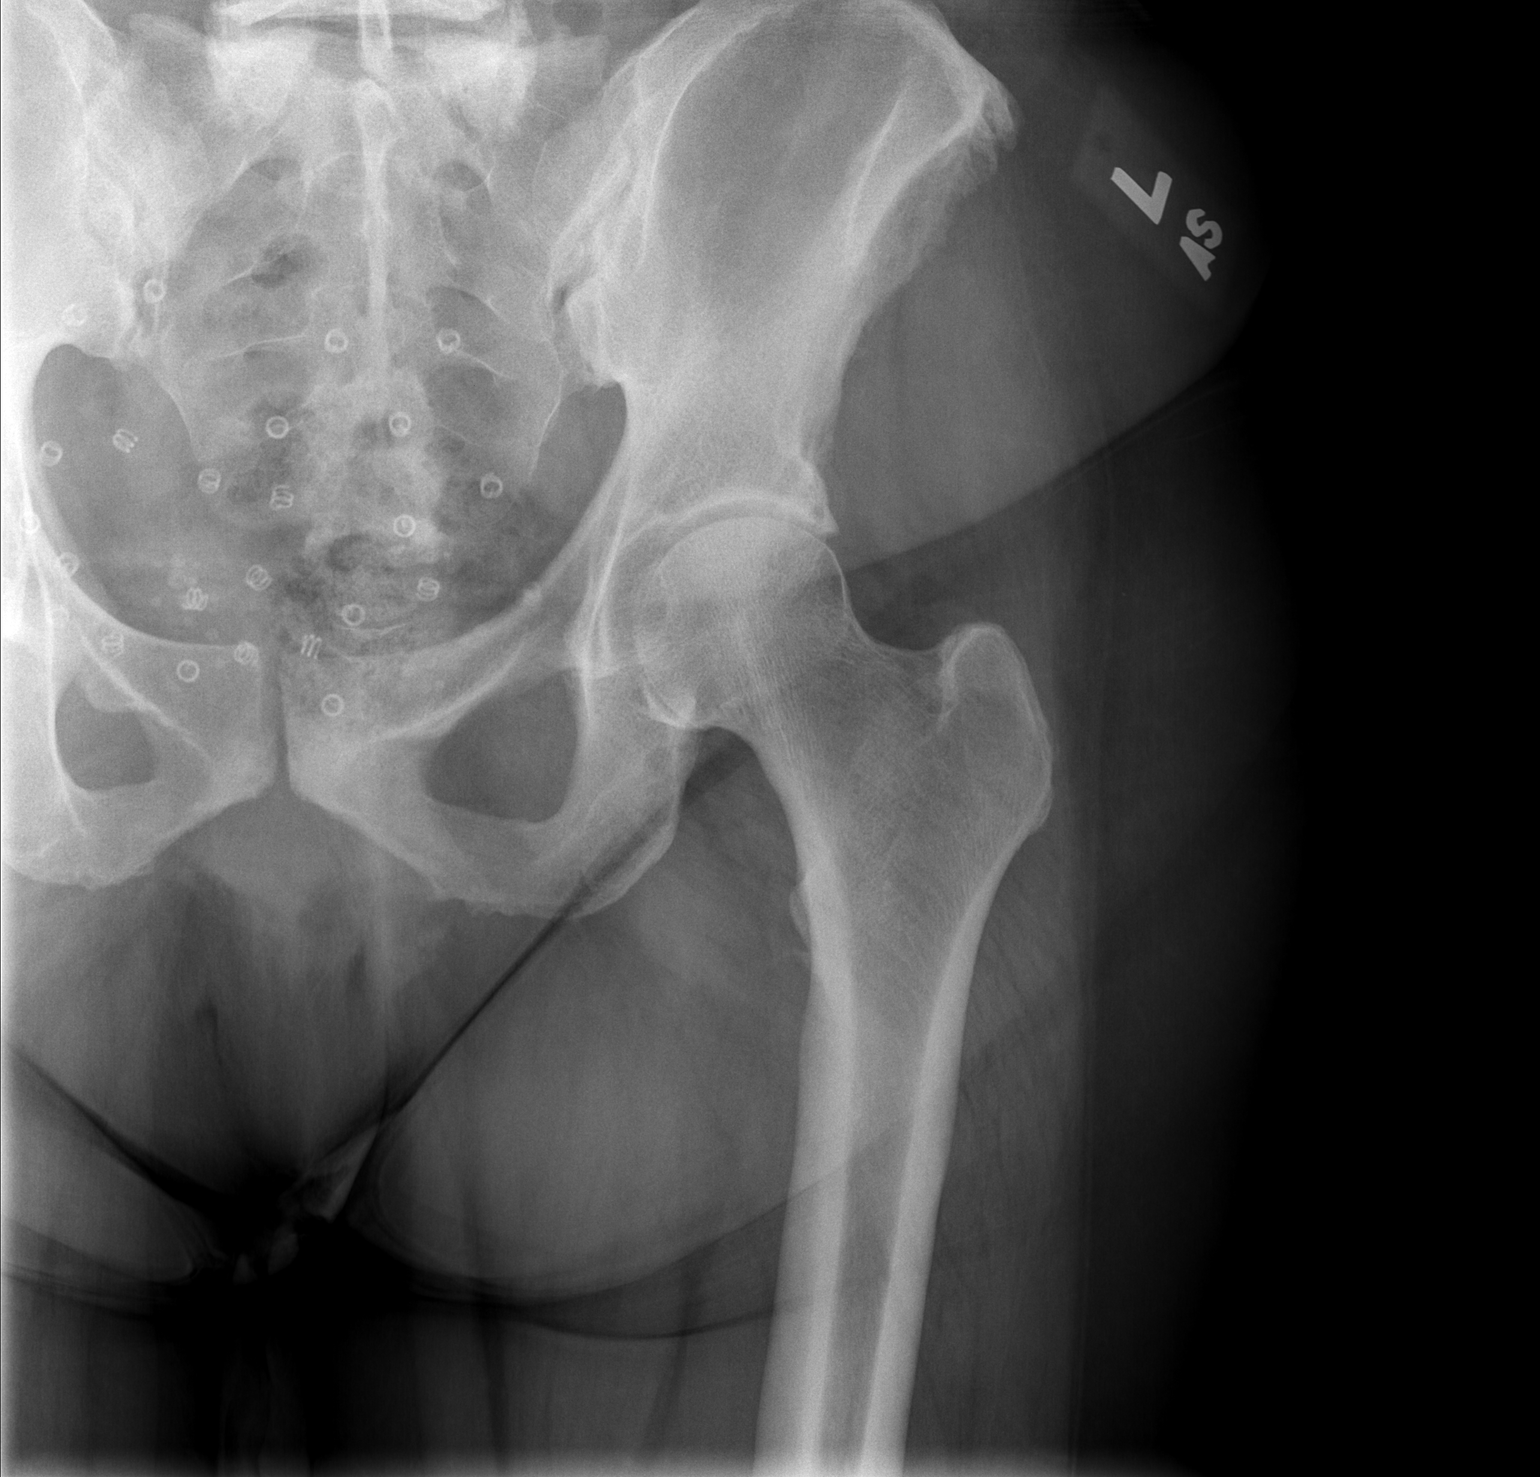

[t hip frog leg left *]
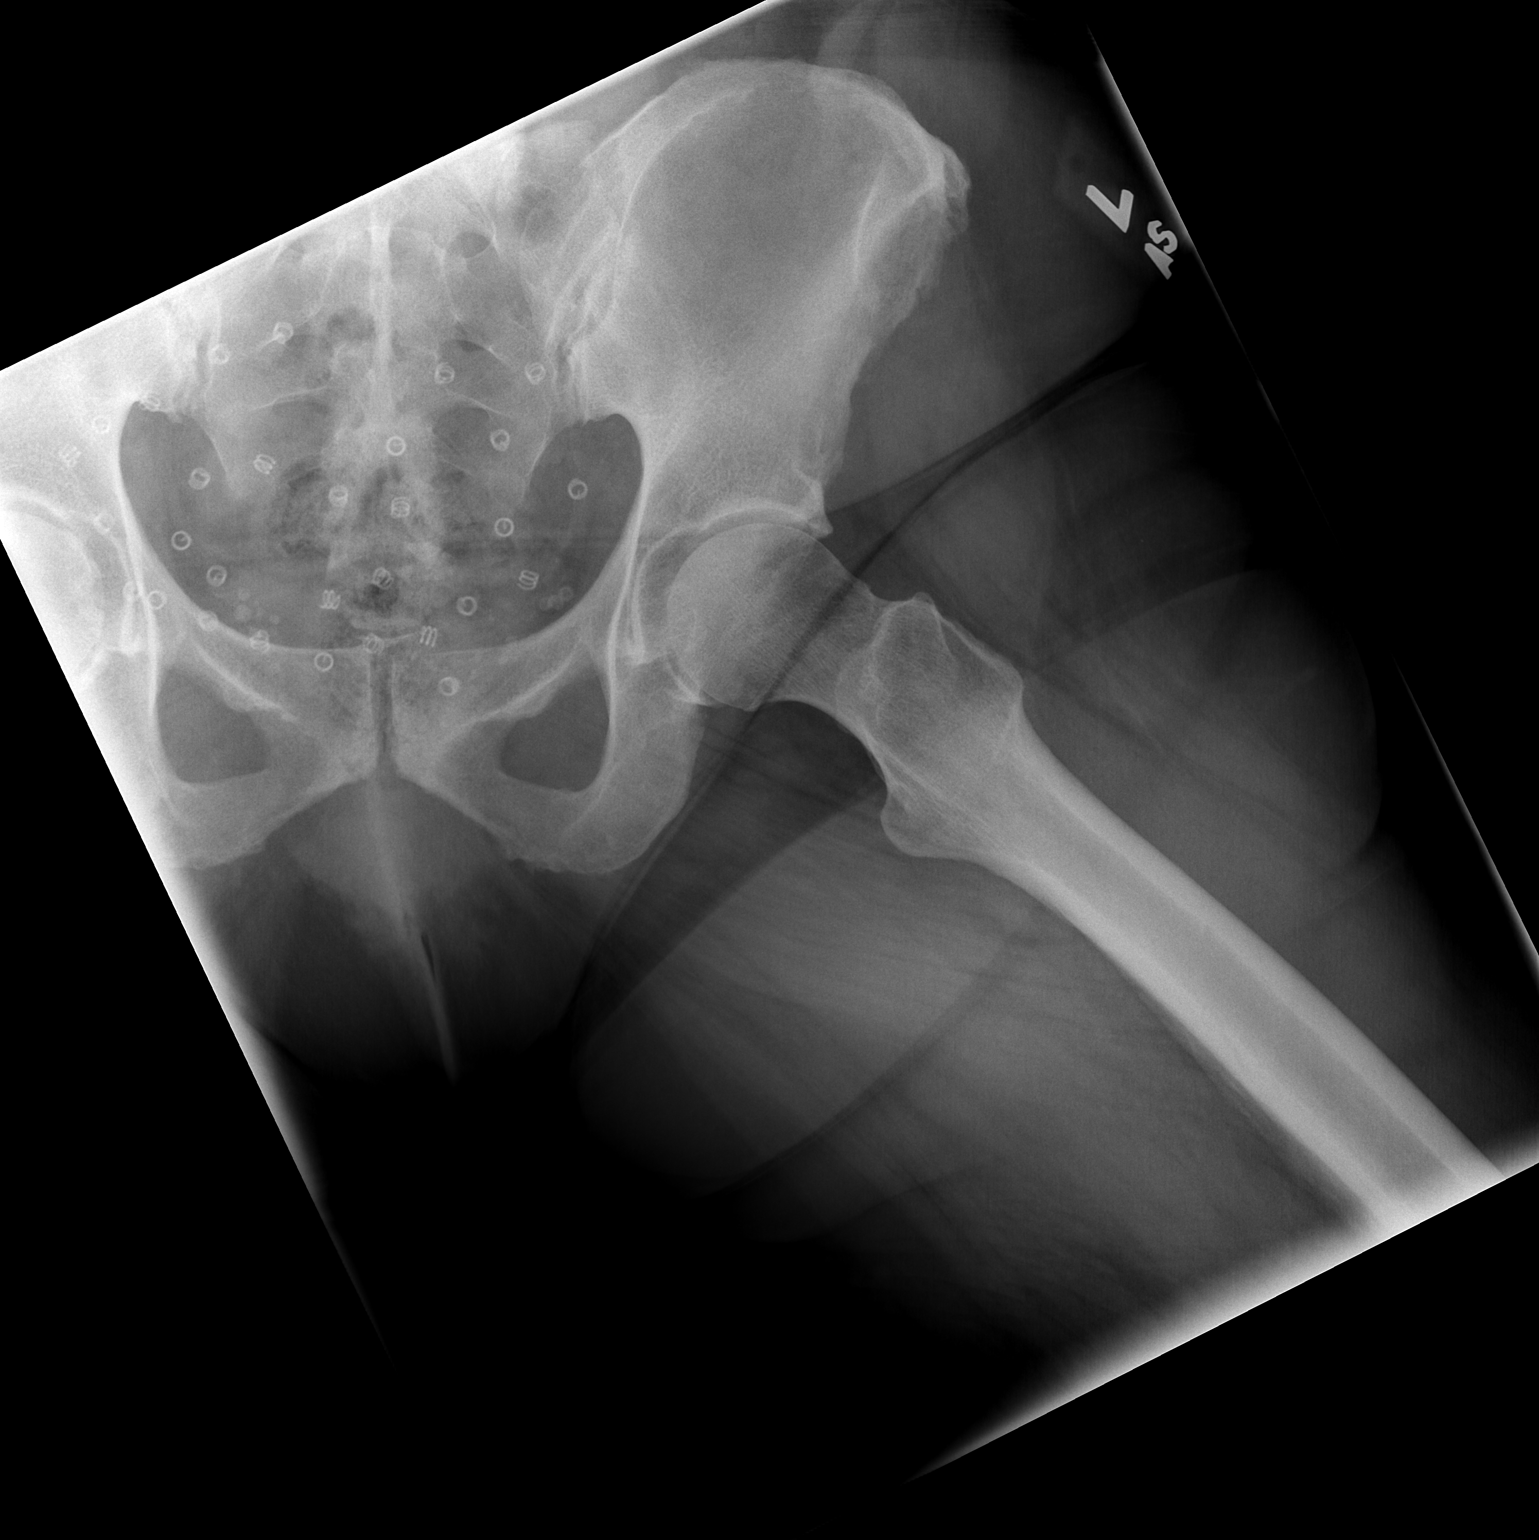

[2 of 2 positions shown; findings below may reference images not displayed]

FINDINGS: The left hip joint space appears stable since the 5373 CT Abdomen
and Pelvis, and within normal limits for age. There is mild
acetabular spurring and subchondral sclerosis, which is similar to
or less pronounced than that seen at the contralateral right hip
last month. The proximal left femur is intact. Bone mineralization
is within normal limits for age. The left SI joint and sacral ala
appear normal.

Sequelae of lower ventral abdominal and pelvic wall hernia repair
with mesh re- demonstrated.
IMPRESSION: No acute osseous abnormality identified and mild for age left hip
joint degeneration.

## 2019-04-30 ENCOUNTER — Other Ambulatory Visit: Payer: Self-pay

## 2019-04-30 ENCOUNTER — Ambulatory Visit
Admission: RE | Admit: 2019-04-30 | Discharge: 2019-04-30 | Disposition: A | Discharge status: Home / Self Care | Payer: Managed Care, Other (non HMO) | Source: Ambulatory Visit | Attending: Obstetrics & Gynecology | Admitting: Obstetrics & Gynecology

## 2019-04-30 DIAGNOSIS — Z1231 Encounter for screening mammogram for malignant neoplasm of breast: Secondary | ICD-10-CM

## 2019-05-03 ENCOUNTER — Other Ambulatory Visit: Payer: Self-pay | Admitting: Obstetrics & Gynecology

## 2019-05-03 DIAGNOSIS — R928 Other abnormal and inconclusive findings on diagnostic imaging of breast: Secondary | ICD-10-CM

## 2019-05-17 ENCOUNTER — Ambulatory Visit
Admission: RE | Admit: 2019-05-17 | Discharge: 2019-05-17 | Disposition: A | Discharge status: Home / Self Care | Payer: Managed Care, Other (non HMO) | Source: Ambulatory Visit | Attending: Obstetrics & Gynecology | Admitting: Obstetrics & Gynecology

## 2019-05-17 ENCOUNTER — Other Ambulatory Visit: Payer: Self-pay

## 2019-05-17 ENCOUNTER — Ambulatory Visit: Payer: PRIVATE HEALTH INSURANCE

## 2019-05-17 DIAGNOSIS — R928 Other abnormal and inconclusive findings on diagnostic imaging of breast: Secondary | ICD-10-CM

## 2019-06-03 ENCOUNTER — Other Ambulatory Visit: Payer: Self-pay | Admitting: Family Medicine

## 2019-06-03 ENCOUNTER — Ambulatory Visit
Admission: RE | Admit: 2019-06-03 | Discharge: 2019-06-03 | Disposition: A | Discharge status: Home / Self Care | Payer: Managed Care, Other (non HMO) | Source: Ambulatory Visit | Attending: Family Medicine | Admitting: Family Medicine

## 2019-06-03 ENCOUNTER — Other Ambulatory Visit: Payer: Managed Care, Other (non HMO)

## 2019-06-03 DIAGNOSIS — R519 Headache, unspecified: Secondary | ICD-10-CM

## 2019-06-03 MED ORDER — IOPAMIDOL (ISOVUE-300) INJECTION 61%
75.0000 mL | Freq: Once | INTRAVENOUS | Status: AC | PRN
  Administered 2019-06-03: 75 mL via INTRAVENOUS

## 2019-06-18 ENCOUNTER — Other Ambulatory Visit: Payer: Managed Care, Other (non HMO)

## 2019-08-26 ENCOUNTER — Other Ambulatory Visit (HOSPITAL_COMMUNITY): Payer: Self-pay | Admitting: Family Medicine

## 2019-08-26 ENCOUNTER — Ambulatory Visit
Admission: RE | Admit: 2019-08-26 | Discharge: 2019-08-26 | Disposition: A | Discharge status: Home / Self Care | Payer: Managed Care, Other (non HMO) | Source: Ambulatory Visit | Attending: Family Medicine | Admitting: Family Medicine

## 2019-08-26 ENCOUNTER — Other Ambulatory Visit: Payer: Self-pay | Admitting: Family Medicine

## 2019-08-26 DIAGNOSIS — G8929 Other chronic pain: Secondary | ICD-10-CM

## 2019-10-16 ENCOUNTER — Other Ambulatory Visit (HOSPITAL_COMMUNITY): Payer: Self-pay | Admitting: Obstetrics & Gynecology

## 2019-10-23 ENCOUNTER — Other Ambulatory Visit: Payer: Self-pay | Admitting: Family Medicine

## 2019-10-23 DIAGNOSIS — M545 Low back pain, unspecified: Secondary | ICD-10-CM

## 2019-11-08 ENCOUNTER — Other Ambulatory Visit: Payer: Self-pay | Admitting: Family Medicine

## 2019-11-12 ENCOUNTER — Other Ambulatory Visit: Payer: Managed Care, Other (non HMO)

## 2019-11-12 ENCOUNTER — Ambulatory Visit
Admission: RE | Admit: 2019-11-12 | Discharge: 2019-11-12 | Disposition: A | Discharge status: Home / Self Care | Payer: Managed Care, Other (non HMO) | Source: Ambulatory Visit | Attending: Family Medicine | Admitting: Family Medicine

## 2019-11-12 DIAGNOSIS — M545 Low back pain, unspecified: Secondary | ICD-10-CM

## 2019-12-16 ENCOUNTER — Other Ambulatory Visit: Payer: Self-pay | Admitting: Orthopedic Surgery

## 2019-12-16 DIAGNOSIS — M25552 Pain in left hip: Secondary | ICD-10-CM

## 2019-12-18 ENCOUNTER — Other Ambulatory Visit (HOSPITAL_COMMUNITY): Payer: Self-pay | Admitting: Internal Medicine

## 2019-12-18 ENCOUNTER — Ambulatory Visit: Payer: Managed Care, Other (non HMO) | Attending: Internal Medicine

## 2019-12-18 DIAGNOSIS — Z23 Encounter for immunization: Secondary | ICD-10-CM

## 2019-12-18 NOTE — Progress Notes (Signed)
   Covid-19 Vaccination Clinic  Name:  DEBI COUSIN    MRN: [id number] DOB: November 19, 1945  12/18/2019  Ms. Geyer was observed post Covid-19 immunization for 15 minutes without incident. She was provided with Vaccine Information Sheet and instruction to access the V-Safe system.   Ms. Shackleford was instructed to call 911 with any severe reactions post vaccine: Marland Kitchen Difficulty breathing  . Swelling of face and throat  . A fast heartbeat  . A bad rash all over body  . Dizziness and weakness

## 2019-12-23 ENCOUNTER — Other Ambulatory Visit: Payer: Managed Care, Other (non HMO)

## 2019-12-23 ENCOUNTER — Ambulatory Visit
Admission: RE | Admit: 2019-12-23 | Discharge: 2019-12-23 | Disposition: A | Discharge status: Home / Self Care | Payer: Managed Care, Other (non HMO) | Source: Ambulatory Visit | Attending: Orthopedic Surgery | Admitting: Orthopedic Surgery

## 2019-12-23 ENCOUNTER — Other Ambulatory Visit: Payer: Self-pay

## 2019-12-23 DIAGNOSIS — M25552 Pain in left hip: Secondary | ICD-10-CM

## 2019-12-23 MED ORDER — METHYLPREDNISOLONE ACETATE 40 MG/ML INJ SUSP (RADIOLOG
120.0000 mg | Freq: Once | INTRAMUSCULAR | Status: AC
  Administered 2019-12-23: 120 mg via INTRA_ARTICULAR

## 2019-12-23 MED ORDER — IOPAMIDOL (ISOVUE-M 200) INJECTION 41%: 1.0000 mL | Freq: Once | INTRAMUSCULAR | Status: DC

## 2019-12-23 MED ORDER — IOPAMIDOL (ISOVUE-M 200) INJECTION 41%
1.0000 mL | Freq: Once | INTRAMUSCULAR | Status: AC
  Administered 2019-12-23: 1 mL via INTRA_ARTICULAR

## 2020-01-15 ENCOUNTER — Other Ambulatory Visit (HOSPITAL_COMMUNITY): Payer: Self-pay | Admitting: Obstetrics & Gynecology

## 2020-01-28 ENCOUNTER — Other Ambulatory Visit (HOSPITAL_COMMUNITY): Payer: Self-pay | Admitting: Obstetrics & Gynecology

## 2020-02-10 ENCOUNTER — Other Ambulatory Visit: Payer: Self-pay | Admitting: Neurosurgery

## 2020-02-10 DIAGNOSIS — M5416 Radiculopathy, lumbar region: Secondary | ICD-10-CM

## 2020-02-14 ENCOUNTER — Ambulatory Visit
Admission: RE | Admit: 2020-02-14 | Discharge: 2020-02-14 | Disposition: A | Discharge status: Home / Self Care | Payer: Managed Care, Other (non HMO) | Source: Ambulatory Visit | Attending: Neurosurgery | Admitting: Neurosurgery

## 2020-02-14 DIAGNOSIS — M5416 Radiculopathy, lumbar region: Secondary | ICD-10-CM

## 2020-02-14 MED ORDER — METHYLPREDNISOLONE ACETATE 40 MG/ML INJ SUSP (RADIOLOG
120.0000 mg | Freq: Once | INTRAMUSCULAR | Status: AC
  Administered 2020-02-14: 120 mg via EPIDURAL

## 2020-02-14 MED ORDER — IOPAMIDOL (ISOVUE-M 200) INJECTION 41%
1.0000 mL | Freq: Once | INTRAMUSCULAR | Status: AC
  Administered 2020-02-14: 1 mL via EPIDURAL

## 2020-02-14 NOTE — Discharge Instructions (Signed)

## 2020-03-09 ENCOUNTER — Other Ambulatory Visit: Payer: Self-pay | Admitting: Obstetrics & Gynecology

## 2020-03-09 DIAGNOSIS — Z1231 Encounter for screening mammogram for malignant neoplasm of breast: Secondary | ICD-10-CM

## 2020-04-09 ENCOUNTER — Other Ambulatory Visit (HOSPITAL_COMMUNITY): Payer: Self-pay | Admitting: Orthopedic Surgery

## 2020-04-21 ENCOUNTER — Other Ambulatory Visit: Payer: Self-pay | Admitting: Orthopedic Surgery

## 2020-04-21 DIAGNOSIS — M1611 Unilateral primary osteoarthritis, right hip: Secondary | ICD-10-CM

## 2020-05-01 ENCOUNTER — Other Ambulatory Visit: Payer: Managed Care, Other (non HMO)

## 2020-05-01 ENCOUNTER — Other Ambulatory Visit: Payer: Self-pay

## 2020-05-01 ENCOUNTER — Ambulatory Visit
Admission: RE | Admit: 2020-05-01 | Discharge: 2020-05-01 | Disposition: A | Discharge status: Home / Self Care | Payer: Managed Care, Other (non HMO) | Source: Ambulatory Visit | Attending: Obstetrics & Gynecology | Admitting: Obstetrics & Gynecology

## 2020-05-01 DIAGNOSIS — Z1231 Encounter for screening mammogram for malignant neoplasm of breast: Secondary | ICD-10-CM

## 2020-05-06 ENCOUNTER — Other Ambulatory Visit: Payer: Self-pay

## 2020-05-06 ENCOUNTER — Ambulatory Visit
Admission: RE | Admit: 2020-05-06 | Discharge: 2020-05-06 | Disposition: A | Discharge status: Home / Self Care | Payer: Managed Care, Other (non HMO) | Source: Ambulatory Visit | Attending: Orthopedic Surgery | Admitting: Orthopedic Surgery

## 2020-05-06 DIAGNOSIS — M1611 Unilateral primary osteoarthritis, right hip: Secondary | ICD-10-CM

## 2020-05-06 MED ORDER — METHYLPREDNISOLONE ACETATE 40 MG/ML INJ SUSP (RADIOLOG
120.0000 mg | Freq: Once | INTRAMUSCULAR | Status: AC
  Administered 2020-05-06: 120 mg via INTRA_ARTICULAR

## 2020-05-06 MED ORDER — IOPAMIDOL (ISOVUE-M 200) INJECTION 41%
1.0000 mL | Freq: Once | INTRAMUSCULAR | Status: AC
  Administered 2020-05-06: 1 mL via INTRA_ARTICULAR

## 2020-05-18 ENCOUNTER — Other Ambulatory Visit (HOSPITAL_COMMUNITY): Payer: Self-pay | Admitting: Surgery

## 2020-06-01 ENCOUNTER — Other Ambulatory Visit: Payer: Self-pay | Admitting: Family Medicine

## 2020-06-01 ENCOUNTER — Other Ambulatory Visit (HOSPITAL_COMMUNITY): Payer: Self-pay

## 2020-06-01 DIAGNOSIS — M79604 Pain in right leg: Secondary | ICD-10-CM

## 2020-06-01 DIAGNOSIS — R0989 Other specified symptoms and signs involving the circulatory and respiratory systems: Secondary | ICD-10-CM

## 2020-06-01 MED FILL — Estradiol Tab 2 MG: ORAL | 30 days supply | Qty: 60 | Fill #0 | Status: AC

## 2020-06-02 ENCOUNTER — Other Ambulatory Visit (HOSPITAL_COMMUNITY): Payer: Self-pay

## 2020-06-02 MED ORDER — CYCLOBENZAPRINE HCL 10 MG PO TABS
ORAL_TABLET | ORAL | 1 refills | Status: DC
  Filled 2020-06-02: qty 30, 10d supply, fill #0

## 2020-06-05 ENCOUNTER — Ambulatory Visit
Admission: RE | Admit: 2020-06-05 | Discharge: 2020-06-05 | Disposition: A | Discharge status: Home / Self Care | Payer: Managed Care, Other (non HMO) | Source: Ambulatory Visit | Attending: Family Medicine | Admitting: Family Medicine

## 2020-06-05 DIAGNOSIS — M79604 Pain in right leg: Secondary | ICD-10-CM

## 2020-06-05 DIAGNOSIS — R0989 Other specified symptoms and signs involving the circulatory and respiratory systems: Secondary | ICD-10-CM

## 2020-06-05 DIAGNOSIS — M79605 Pain in left leg: Secondary | ICD-10-CM

## 2020-06-10 ENCOUNTER — Other Ambulatory Visit (HOSPITAL_COMMUNITY): Payer: Self-pay

## 2020-06-10 MED FILL — Rosuvastatin Calcium Tab 5 MG: ORAL | 30 days supply | Qty: 30 | Fill #0 | Status: AC

## 2020-06-10 MED FILL — Zolpidem Tartrate Tab ER 12.5 MG: ORAL | 30 days supply | Qty: 30 | Fill #0 | Status: AC

## 2020-06-29 ENCOUNTER — Other Ambulatory Visit (HOSPITAL_COMMUNITY): Payer: Self-pay

## 2020-06-29 MED FILL — Estradiol Tab 2 MG: ORAL | 30 days supply | Qty: 60 | Fill #1 | Status: AC

## 2020-07-01 ENCOUNTER — Ambulatory Visit: Payer: Managed Care, Other (non HMO) | Attending: Internal Medicine

## 2020-07-01 DIAGNOSIS — Z23 Encounter for immunization: Secondary | ICD-10-CM

## 2020-07-01 NOTE — Progress Notes (Signed)
   Covid-19 Vaccination Clinic  Name:  Ashley Knapp    MRN: [id number] DOB: [birth year]  07/01/2020  Ms. [name] was observed post Covid-19 immunization for 15 minutes without incident. She was provided with Vaccine Information Sheet and instruction to access the V-Safe system.   Ms. [name] was instructed to call 911 with any severe reactions post vaccine: Marland Kitchen Difficulty breathing  . Swelling of face and throat  . A fast heartbeat  . A bad rash all over body  . Dizziness and weakness   Immunizations Administered    Name Date Dose VIS Date Route   PFIZER Comrnaty(Gray TOP) Covid-19 Vaccine 07/01/2020  9:16 AM 0.3 mL 02/06/2020 Intramuscular   Manufacturer: [employer], [employer]   Lot: [id number]   NDC: [id number]

## 2020-07-02 ENCOUNTER — Other Ambulatory Visit (HOSPITAL_COMMUNITY): Payer: Self-pay

## 2020-07-02 MED ORDER — COVID-19 MRNA VAC-TRIS(PFIZER) 30 MCG/0.3ML IM SUSP
INTRAMUSCULAR | 0 refills | Status: DC
  Filled 2020-07-02: qty 0.3, 17d supply, fill #0

## 2020-07-08 ENCOUNTER — Other Ambulatory Visit (HOSPITAL_COMMUNITY): Payer: Self-pay

## 2020-07-09 ENCOUNTER — Other Ambulatory Visit (HOSPITAL_COMMUNITY): Payer: Self-pay

## 2020-07-09 MED ORDER — ZOLPIDEM TARTRATE ER 12.5 MG PO TBCR
EXTENDED_RELEASE_TABLET | ORAL | 0 refills | Status: DC
  Filled 2020-07-09: qty 30, 30d supply, fill #0

## 2020-07-14 ENCOUNTER — Other Ambulatory Visit (HOSPITAL_COMMUNITY): Payer: Self-pay

## 2020-07-14 MED FILL — Rosuvastatin Calcium Tab 5 MG: ORAL | 30 days supply | Qty: 30 | Fill #1 | Status: AC

## 2020-07-31 ENCOUNTER — Other Ambulatory Visit (HOSPITAL_COMMUNITY): Payer: Self-pay

## 2020-07-31 MED FILL — Olopatadine HCl Ophth Soln 0.1% (Base Equivalent): OPHTHALMIC | 25 days supply | Qty: 5 | Fill #0 | Status: AC

## 2020-08-05 ENCOUNTER — Other Ambulatory Visit (HOSPITAL_COMMUNITY): Payer: Self-pay

## 2020-08-05 MED ORDER — NITROFURANTOIN MONOHYD MACRO 100 MG PO CAPS
ORAL_CAPSULE | ORAL | 99 refills | Status: AC
  Filled 2020-08-05: qty 30, 30d supply, fill #0
  Filled 2020-12-23: qty 30, 30d supply, fill #1
  Filled 2021-02-26: qty 30, 30d supply, fill #2
  Filled 2021-02-26 (×2): qty 30, 30d supply, fill #0

## 2020-08-05 MED ORDER — ZOLPIDEM TARTRATE ER 12.5 MG PO TBCR
EXTENDED_RELEASE_TABLET | ORAL | 5 refills | Status: DC
  Filled 2020-08-06: qty 30, 30d supply, fill #0
  Filled 2020-09-08: qty 30, 30d supply, fill #1
  Filled 2020-10-08: qty 30, 30d supply, fill #0
  Filled 2020-10-08: qty 30, 30d supply, fill #2
  Filled 2020-10-09: qty 30, 30d supply, fill #0
  Filled 2020-11-06: qty 30, 30d supply, fill #1

## 2020-08-05 MED FILL — Estradiol Tab 2 MG: ORAL | 30 days supply | Qty: 60 | Fill #2 | Status: CN

## 2020-08-06 ENCOUNTER — Other Ambulatory Visit (HOSPITAL_COMMUNITY): Payer: Self-pay

## 2020-08-06 MED FILL — Estradiol Tab 2 MG: ORAL | 30 days supply | Qty: 60 | Fill #2 | Status: CN

## 2020-08-07 ENCOUNTER — Other Ambulatory Visit (HOSPITAL_COMMUNITY): Payer: Self-pay

## 2020-08-10 ENCOUNTER — Other Ambulatory Visit (HOSPITAL_COMMUNITY): Payer: Self-pay

## 2020-08-10 MED FILL — Estradiol Tab 2 MG: ORAL | 30 days supply | Qty: 60 | Fill #2 | Status: AC

## 2020-08-13 ENCOUNTER — Other Ambulatory Visit (HOSPITAL_COMMUNITY): Payer: Self-pay

## 2020-08-18 ENCOUNTER — Other Ambulatory Visit (HOSPITAL_COMMUNITY): Payer: Self-pay

## 2020-08-18 MED FILL — Rosuvastatin Calcium Tab 5 MG: ORAL | 30 days supply | Qty: 30 | Fill #2 | Status: AC

## 2020-08-25 ENCOUNTER — Other Ambulatory Visit (HOSPITAL_COMMUNITY): Payer: Self-pay

## 2020-08-25 MED ORDER — TRAMADOL HCL 50 MG PO TABS
ORAL_TABLET | ORAL | 0 refills | Status: DC
  Filled 2020-08-25: qty 40, 10d supply, fill #0

## 2020-08-25 MED ORDER — CLINDAMYCIN HCL 300 MG PO CAPS
ORAL_CAPSULE | ORAL | 0 refills | Status: DC
  Filled 2020-08-25: qty 12, 3d supply, fill #0

## 2020-08-25 MED ORDER — OXYCODONE HCL 5 MG PO TABS
ORAL_TABLET | ORAL | 0 refills | Status: DC
  Filled 2020-08-25: qty 40, 7d supply, fill #0

## 2020-08-25 MED ORDER — PROMETHAZINE HCL 12.5 MG PO TABS
ORAL_TABLET | ORAL | 0 refills | Status: DC
  Filled 2020-08-25: qty 40, 10d supply, fill #0

## 2020-08-25 MED ORDER — METHOCARBAMOL 500 MG PO TABS
ORAL_TABLET | ORAL | 0 refills | Status: DC
  Filled 2020-08-25: qty 40, 10d supply, fill #0

## 2020-09-08 ENCOUNTER — Other Ambulatory Visit (HOSPITAL_COMMUNITY): Payer: Self-pay

## 2020-09-08 MED FILL — Estradiol Tab 2 MG: ORAL | 30 days supply | Qty: 60 | Fill #3 | Status: AC

## 2020-09-21 ENCOUNTER — Other Ambulatory Visit (HOSPITAL_COMMUNITY): Payer: Self-pay

## 2020-09-24 ENCOUNTER — Other Ambulatory Visit (HOSPITAL_COMMUNITY): Payer: Self-pay

## 2020-09-24 MED ORDER — ROSUVASTATIN CALCIUM 5 MG PO TABS
5.0000 mg | ORAL_TABLET | Freq: Every day | ORAL | 3 refills | Status: DC
  Filled 2020-09-24 – 2020-09-25 (×2): qty 90, 90d supply, fill #0
  Filled 2020-12-23: qty 90, 90d supply, fill #1
  Filled 2021-03-26: qty 90, 90d supply, fill #0
  Filled 2021-03-26: qty 90, 90d supply, fill #2
  Filled 2021-06-23: qty 90, 90d supply, fill #1

## 2020-09-25 ENCOUNTER — Other Ambulatory Visit (HOSPITAL_COMMUNITY): Payer: Self-pay

## 2020-10-02 ENCOUNTER — Other Ambulatory Visit (HOSPITAL_COMMUNITY): Payer: Self-pay

## 2020-10-05 ENCOUNTER — Other Ambulatory Visit (HOSPITAL_COMMUNITY): Payer: Self-pay

## 2020-10-05 MED ORDER — ROSUVASTATIN CALCIUM 5 MG PO TABS
ORAL_TABLET | ORAL | 3 refills | Status: DC
  Filled 2020-10-05: qty 90, 90d supply, fill #0

## 2020-10-08 ENCOUNTER — Other Ambulatory Visit (HOSPITAL_COMMUNITY): Payer: Self-pay

## 2020-10-08 MED FILL — Estradiol Tab 2 MG: ORAL | 30 days supply | Qty: 60 | Fill #0 | Status: CN

## 2020-10-08 MED FILL — Estradiol Tab 2 MG: ORAL | 30 days supply | Qty: 60 | Fill #4 | Status: CN

## 2020-10-09 ENCOUNTER — Other Ambulatory Visit (HOSPITAL_COMMUNITY): Payer: Self-pay

## 2020-10-09 ENCOUNTER — Other Ambulatory Visit: Payer: Self-pay | Admitting: Student

## 2020-10-09 DIAGNOSIS — M1611 Unilateral primary osteoarthritis, right hip: Secondary | ICD-10-CM

## 2020-10-09 MED FILL — Estradiol Tab 2 MG: ORAL | 30 days supply | Qty: 60 | Fill #0 | Status: AC

## 2020-10-16 ENCOUNTER — Other Ambulatory Visit: Payer: Self-pay

## 2020-10-16 ENCOUNTER — Ambulatory Visit
Admission: RE | Admit: 2020-10-16 | Discharge: 2020-10-16 | Disposition: A | Discharge status: Home / Self Care | Payer: Managed Care, Other (non HMO) | Source: Ambulatory Visit | Attending: Student | Admitting: Student

## 2020-10-16 DIAGNOSIS — M1611 Unilateral primary osteoarthritis, right hip: Secondary | ICD-10-CM

## 2020-10-16 MED ORDER — METHYLPREDNISOLONE ACETATE 40 MG/ML INJ SUSP (RADIOLOG
80.0000 mg | Freq: Once | INTRAMUSCULAR | Status: AC
  Administered 2020-10-16: 80 mg via INTRA_ARTICULAR

## 2020-10-16 MED ORDER — IOPAMIDOL (ISOVUE-M 200) INJECTION 41%
1.0000 mL | Freq: Once | INTRAMUSCULAR | Status: AC
  Administered 2020-10-16: 1 mL via INTRA_ARTICULAR

## 2020-10-21 ENCOUNTER — Other Ambulatory Visit: Payer: Managed Care, Other (non HMO)

## 2020-10-30 ENCOUNTER — Other Ambulatory Visit (HOSPITAL_COMMUNITY): Payer: Self-pay

## 2020-10-30 MED ORDER — ZOLPIDEM TARTRATE ER 12.5 MG PO TBCR
EXTENDED_RELEASE_TABLET | ORAL | 5 refills | Status: AC
  Filled 2020-10-30: qty 60, 60d supply, fill #0
  Filled 2020-12-08: qty 30, 30d supply, fill #0
  Filled 2021-01-07: qty 30, 30d supply, fill #1
  Filled 2021-02-05: qty 30, 30d supply, fill #2

## 2020-10-30 MED ORDER — ESTRADIOL 2 MG PO TABS
ORAL_TABLET | ORAL | 4 refills | Status: DC
  Filled 2020-10-30: qty 90, 90d supply, fill #0
  Filled 2020-12-23: qty 90, 90d supply, fill #1

## 2020-11-06 ENCOUNTER — Other Ambulatory Visit (HOSPITAL_COMMUNITY): Payer: Self-pay

## 2020-11-20 ENCOUNTER — Other Ambulatory Visit (HOSPITAL_COMMUNITY): Payer: Self-pay

## 2020-11-20 MED ORDER — METHOCARBAMOL 500 MG PO TABS
ORAL_TABLET | ORAL | 0 refills | Status: DC
  Filled 2020-11-20: qty 40, 10d supply, fill #0

## 2020-11-20 MED FILL — Olopatadine HCl Ophth Soln 0.1% (Base Equivalent): OPHTHALMIC | 25 days supply | Qty: 5 | Fill #1 | Status: AC

## 2020-12-08 ENCOUNTER — Other Ambulatory Visit (HOSPITAL_COMMUNITY): Payer: Self-pay

## 2020-12-23 ENCOUNTER — Other Ambulatory Visit (HOSPITAL_COMMUNITY): Payer: Self-pay

## 2020-12-29 ENCOUNTER — Other Ambulatory Visit (HOSPITAL_COMMUNITY): Payer: Self-pay

## 2020-12-29 MED ORDER — ESTRADIOL 2 MG PO TABS
2.0000 mg | ORAL_TABLET | Freq: Every day | ORAL | 4 refills | Status: DC
  Filled 2020-12-29: qty 90, 90d supply, fill #0

## 2020-12-30 ENCOUNTER — Other Ambulatory Visit (HOSPITAL_COMMUNITY): Payer: Self-pay

## 2020-12-30 MED ORDER — ESTRADIOL 2 MG PO TABS
ORAL_TABLET | ORAL | 99 refills | Status: DC
  Filled 2020-12-30: qty 180, 90d supply, fill #0
  Filled 2021-03-26: qty 180, 90d supply, fill #1
  Filled 2021-07-05: qty 180, 90d supply, fill #2
  Filled 2021-10-04: qty 180, 90d supply, fill #3

## 2021-01-01 ENCOUNTER — Other Ambulatory Visit (HOSPITAL_COMMUNITY): Payer: Self-pay

## 2021-01-07 ENCOUNTER — Other Ambulatory Visit (HOSPITAL_COMMUNITY): Payer: Self-pay

## 2021-01-11 ENCOUNTER — Other Ambulatory Visit: Payer: Self-pay | Admitting: Student

## 2021-01-11 DIAGNOSIS — M1611 Unilateral primary osteoarthritis, right hip: Secondary | ICD-10-CM

## 2021-01-29 ENCOUNTER — Other Ambulatory Visit: Payer: Self-pay

## 2021-01-29 ENCOUNTER — Ambulatory Visit
Admission: RE | Admit: 2021-01-29 | Discharge: 2021-01-29 | Disposition: A | Discharge status: Home / Self Care | Payer: Managed Care, Other (non HMO) | Source: Ambulatory Visit | Attending: Student | Admitting: Student

## 2021-01-29 DIAGNOSIS — M1611 Unilateral primary osteoarthritis, right hip: Secondary | ICD-10-CM

## 2021-01-29 MED ORDER — IOPAMIDOL (ISOVUE-M 200) INJECTION 41%
1.0000 mL | Freq: Once | INTRAMUSCULAR | Status: AC
  Administered 2021-01-29: 1 mL via INTRA_ARTICULAR

## 2021-01-29 MED ORDER — METHYLPREDNISOLONE ACETATE 40 MG/ML INJ SUSP (RADIOLOG
80.0000 mg | Freq: Once | INTRAMUSCULAR | Status: AC
  Administered 2021-01-29: 80 mg via INTRA_ARTICULAR

## 2021-02-05 ENCOUNTER — Other Ambulatory Visit (HOSPITAL_COMMUNITY): Payer: Self-pay

## 2021-02-11 ENCOUNTER — Other Ambulatory Visit: Payer: Self-pay | Admitting: Orthopedic Surgery

## 2021-02-11 DIAGNOSIS — M1612 Unilateral primary osteoarthritis, left hip: Secondary | ICD-10-CM

## 2021-02-26 ENCOUNTER — Other Ambulatory Visit (HOSPITAL_COMMUNITY): Payer: Self-pay

## 2021-02-26 ENCOUNTER — Ambulatory Visit
Admission: RE | Admit: 2021-02-26 | Discharge: 2021-02-26 | Disposition: A | Discharge status: Home / Self Care | Payer: Managed Care, Other (non HMO) | Source: Ambulatory Visit | Attending: Orthopedic Surgery | Admitting: Orthopedic Surgery

## 2021-02-26 ENCOUNTER — Other Ambulatory Visit: Payer: Self-pay

## 2021-02-26 DIAGNOSIS — M1612 Unilateral primary osteoarthritis, left hip: Secondary | ICD-10-CM

## 2021-02-26 MED ORDER — IOPAMIDOL (ISOVUE-M 200) INJECTION 41%
1.0000 mL | Freq: Once | INTRAMUSCULAR | Status: AC
  Administered 2021-02-26: 1 mL via INTRA_ARTICULAR

## 2021-02-26 MED ORDER — METHYLPREDNISOLONE ACETATE 40 MG/ML INJ SUSP (RADIOLOG
80.0000 mg | Freq: Once | INTRAMUSCULAR | Status: AC
  Administered 2021-02-26: 80 mg via INTRA_ARTICULAR

## 2021-03-05 ENCOUNTER — Other Ambulatory Visit (HOSPITAL_COMMUNITY): Payer: Self-pay

## 2021-03-05 DIAGNOSIS — M7062 Trochanteric bursitis, left hip: Secondary | ICD-10-CM | POA: Diagnosis not present

## 2021-03-05 DIAGNOSIS — M160 Bilateral primary osteoarthritis of hip: Secondary | ICD-10-CM | POA: Diagnosis not present

## 2021-03-08 ENCOUNTER — Other Ambulatory Visit (HOSPITAL_COMMUNITY): Payer: Self-pay

## 2021-03-08 MED ORDER — MELOXICAM 15 MG PO TABS
15.0000 mg | ORAL_TABLET | Freq: Every day | ORAL | 3 refills | Status: DC
  Filled 2021-03-08: qty 30, 30d supply, fill #0
  Filled 2021-04-08: qty 30, 30d supply, fill #1

## 2021-03-18 DIAGNOSIS — R030 Elevated blood-pressure reading, without diagnosis of hypertension: Secondary | ICD-10-CM | POA: Diagnosis not present

## 2021-03-18 DIAGNOSIS — M48061 Spinal stenosis, lumbar region without neurogenic claudication: Secondary | ICD-10-CM | POA: Diagnosis not present

## 2021-03-19 ENCOUNTER — Other Ambulatory Visit: Payer: Self-pay | Admitting: Neurosurgery

## 2021-03-19 DIAGNOSIS — M48061 Spinal stenosis, lumbar region without neurogenic claudication: Secondary | ICD-10-CM

## 2021-03-21 ENCOUNTER — Other Ambulatory Visit: Payer: Self-pay

## 2021-03-21 ENCOUNTER — Ambulatory Visit
Admission: RE | Admit: 2021-03-21 | Discharge: 2021-03-21 | Disposition: A | Discharge status: Home / Self Care | Payer: Medicare Other | Source: Ambulatory Visit | Attending: Neurosurgery | Admitting: Neurosurgery

## 2021-03-21 DIAGNOSIS — M48061 Spinal stenosis, lumbar region without neurogenic claudication: Secondary | ICD-10-CM

## 2021-03-21 DIAGNOSIS — M47816 Spondylosis without myelopathy or radiculopathy, lumbar region: Secondary | ICD-10-CM | POA: Diagnosis not present

## 2021-03-21 DIAGNOSIS — M4804 Spinal stenosis, thoracic region: Secondary | ICD-10-CM | POA: Diagnosis not present

## 2021-03-21 DIAGNOSIS — M5126 Other intervertebral disc displacement, lumbar region: Secondary | ICD-10-CM | POA: Diagnosis not present

## 2021-03-21 MED ORDER — GADOBENATE DIMEGLUMINE 529 MG/ML IV SOLN
20.0000 mL | Freq: Once | INTRAVENOUS | Status: AC | PRN
  Administered 2021-03-21: 20 mL via INTRAVENOUS

## 2021-03-26 ENCOUNTER — Other Ambulatory Visit (HOSPITAL_COMMUNITY): Payer: Self-pay

## 2021-03-29 DIAGNOSIS — I10 Essential (primary) hypertension: Secondary | ICD-10-CM | POA: Diagnosis not present

## 2021-03-29 DIAGNOSIS — M48061 Spinal stenosis, lumbar region without neurogenic claudication: Secondary | ICD-10-CM | POA: Diagnosis not present

## 2021-03-30 ENCOUNTER — Other Ambulatory Visit: Payer: Self-pay | Admitting: Neurosurgery

## 2021-03-30 DIAGNOSIS — M48062 Spinal stenosis, lumbar region with neurogenic claudication: Secondary | ICD-10-CM

## 2021-03-31 ENCOUNTER — Other Ambulatory Visit: Payer: Self-pay | Admitting: Obstetrics & Gynecology

## 2021-03-31 DIAGNOSIS — Z1231 Encounter for screening mammogram for malignant neoplasm of breast: Secondary | ICD-10-CM

## 2021-04-08 ENCOUNTER — Other Ambulatory Visit (HOSPITAL_COMMUNITY): Payer: Self-pay

## 2021-04-08 MED FILL — Olopatadine HCl Ophth Soln 0.1% (Base Equivalent): OPHTHALMIC | 25 days supply | Qty: 5 | Fill #0 | Status: CN

## 2021-04-09 ENCOUNTER — Other Ambulatory Visit (HOSPITAL_COMMUNITY): Payer: Self-pay

## 2021-04-09 MED ORDER — CLINDAMYCIN HCL 300 MG PO CAPS
ORAL_CAPSULE | ORAL | 2 refills | Status: DC
  Filled 2021-04-09: qty 4, 1d supply, fill #0

## 2021-04-20 ENCOUNTER — Ambulatory Visit
Admission: RE | Admit: 2021-04-20 | Discharge: 2021-04-20 | Disposition: A | Discharge status: Home / Self Care | Payer: Medicare Other | Source: Ambulatory Visit | Attending: Neurosurgery | Admitting: Neurosurgery

## 2021-04-20 ENCOUNTER — Inpatient Hospital Stay
Admission: RE | Admit: 2021-04-20 | Discharge: 2021-04-20 | Disposition: A | Discharge status: Home / Self Care | Payer: Medicare Other | Source: Ambulatory Visit | Attending: Neurosurgery | Admitting: Neurosurgery

## 2021-04-20 DIAGNOSIS — M48061 Spinal stenosis, lumbar region without neurogenic claudication: Secondary | ICD-10-CM | POA: Diagnosis not present

## 2021-04-20 DIAGNOSIS — M47817 Spondylosis without myelopathy or radiculopathy, lumbosacral region: Secondary | ICD-10-CM | POA: Diagnosis not present

## 2021-04-20 DIAGNOSIS — M48062 Spinal stenosis, lumbar region with neurogenic claudication: Secondary | ICD-10-CM

## 2021-04-20 MED ORDER — IOPAMIDOL (ISOVUE-M 200) INJECTION 41%
1.0000 mL | Freq: Once | INTRAMUSCULAR | Status: AC
  Administered 2021-04-20: 1 mL via EPIDURAL

## 2021-04-20 MED ORDER — METHYLPREDNISOLONE ACETATE 40 MG/ML INJ SUSP (RADIOLOG
80.0000 mg | Freq: Once | INTRAMUSCULAR | Status: AC
  Administered 2021-04-20: 80 mg via EPIDURAL

## 2021-04-20 NOTE — Discharge Instructions (Signed)

## 2021-04-20 NOTE — Discharge Instructions (Signed)

## 2021-05-03 ENCOUNTER — Ambulatory Visit
Admission: RE | Admit: 2021-05-03 | Discharge: 2021-05-03 | Disposition: A | Discharge status: Home / Self Care | Payer: Medicare Other | Source: Ambulatory Visit | Attending: Obstetrics & Gynecology | Admitting: Obstetrics & Gynecology

## 2021-05-03 ENCOUNTER — Other Ambulatory Visit: Payer: Self-pay

## 2021-05-03 DIAGNOSIS — Z1231 Encounter for screening mammogram for malignant neoplasm of breast: Secondary | ICD-10-CM

## 2021-06-24 ENCOUNTER — Other Ambulatory Visit (HOSPITAL_COMMUNITY): Payer: Self-pay

## 2021-07-05 ENCOUNTER — Other Ambulatory Visit (HOSPITAL_COMMUNITY): Payer: Self-pay

## 2021-07-08 ENCOUNTER — Other Ambulatory Visit: Payer: Self-pay | Admitting: Family Medicine

## 2021-07-08 ENCOUNTER — Ambulatory Visit
Admission: RE | Admit: 2021-07-08 | Discharge: 2021-07-08 | Disposition: A | Discharge status: Home / Self Care | Payer: Medicare Other | Source: Ambulatory Visit | Attending: Family Medicine | Admitting: Family Medicine

## 2021-07-08 DIAGNOSIS — I10 Essential (primary) hypertension: Secondary | ICD-10-CM | POA: Diagnosis not present

## 2021-07-08 DIAGNOSIS — R0609 Other forms of dyspnea: Secondary | ICD-10-CM

## 2021-07-08 DIAGNOSIS — R609 Edema, unspecified: Secondary | ICD-10-CM

## 2021-07-08 DIAGNOSIS — R5383 Other fatigue: Secondary | ICD-10-CM | POA: Diagnosis not present

## 2021-07-09 ENCOUNTER — Other Ambulatory Visit (HOSPITAL_COMMUNITY): Payer: Self-pay

## 2021-07-09 MED ORDER — TRIAMTERENE-HCTZ 37.5-25 MG PO TABS
ORAL_TABLET | ORAL | 6 refills | Status: DC
  Filled 2021-07-09: qty 30, 30d supply, fill #0

## 2021-07-12 ENCOUNTER — Other Ambulatory Visit (HOSPITAL_COMMUNITY): Payer: Self-pay

## 2021-07-23 DIAGNOSIS — H35063 Retinal vasculitis, bilateral: Secondary | ICD-10-CM | POA: Diagnosis not present

## 2021-07-23 DIAGNOSIS — H40023 Open angle with borderline findings, high risk, bilateral: Secondary | ICD-10-CM | POA: Diagnosis not present

## 2021-07-23 DIAGNOSIS — H25813 Combined forms of age-related cataract, bilateral: Secondary | ICD-10-CM | POA: Diagnosis not present

## 2021-07-23 DIAGNOSIS — H43393 Other vitreous opacities, bilateral: Secondary | ICD-10-CM | POA: Diagnosis not present

## 2021-08-06 DIAGNOSIS — R070 Pain in throat: Secondary | ICD-10-CM | POA: Diagnosis not present

## 2021-08-06 DIAGNOSIS — R609 Edema, unspecified: Secondary | ICD-10-CM | POA: Diagnosis not present

## 2021-08-06 DIAGNOSIS — Q383 Other congenital malformations of tongue: Secondary | ICD-10-CM | POA: Diagnosis not present

## 2021-08-06 DIAGNOSIS — I10 Essential (primary) hypertension: Secondary | ICD-10-CM | POA: Diagnosis not present

## 2021-08-10 ENCOUNTER — Other Ambulatory Visit (HOSPITAL_COMMUNITY): Payer: Self-pay

## 2021-08-10 MED ORDER — METOPROLOL SUCCINATE ER 25 MG PO TB24
ORAL_TABLET | ORAL | 2 refills | Status: DC
  Filled 2021-08-10: qty 30, 30d supply, fill #0

## 2021-09-08 ENCOUNTER — Other Ambulatory Visit (HOSPITAL_COMMUNITY): Payer: Self-pay

## 2021-09-08 DIAGNOSIS — I10 Essential (primary) hypertension: Secondary | ICD-10-CM | POA: Diagnosis not present

## 2021-09-08 DIAGNOSIS — E7800 Pure hypercholesterolemia, unspecified: Secondary | ICD-10-CM | POA: Diagnosis not present

## 2021-09-08 MED ORDER — ROSUVASTATIN CALCIUM 5 MG PO TABS
ORAL_TABLET | ORAL | 3 refills | Status: AC
  Filled 2021-09-08: qty 90, 90d supply, fill #0

## 2021-09-08 MED ORDER — METOPROLOL SUCCINATE ER 25 MG PO TB24
ORAL_TABLET | ORAL | 4 refills | Status: AC
  Filled 2021-09-08: qty 90, 90d supply, fill #0
  Filled 2021-12-03: qty 90, 90d supply, fill #1

## 2021-09-15 DIAGNOSIS — Z96651 Presence of right artificial knee joint: Secondary | ICD-10-CM | POA: Diagnosis not present

## 2021-09-28 DIAGNOSIS — M7062 Trochanteric bursitis, left hip: Secondary | ICD-10-CM | POA: Diagnosis not present

## 2021-09-28 DIAGNOSIS — M1611 Unilateral primary osteoarthritis, right hip: Secondary | ICD-10-CM | POA: Diagnosis not present

## 2021-09-28 DIAGNOSIS — M160 Bilateral primary osteoarthritis of hip: Secondary | ICD-10-CM | POA: Diagnosis not present

## 2021-09-29 ENCOUNTER — Other Ambulatory Visit: Payer: Self-pay | Admitting: Orthopedic Surgery

## 2021-09-29 DIAGNOSIS — M1611 Unilateral primary osteoarthritis, right hip: Secondary | ICD-10-CM

## 2021-09-29 DIAGNOSIS — M7062 Trochanteric bursitis, left hip: Secondary | ICD-10-CM

## 2021-10-04 ENCOUNTER — Other Ambulatory Visit (HOSPITAL_COMMUNITY): Payer: Self-pay

## 2021-10-06 DIAGNOSIS — M25661 Stiffness of right knee, not elsewhere classified: Secondary | ICD-10-CM | POA: Diagnosis not present

## 2021-10-11 ENCOUNTER — Ambulatory Visit
Admission: RE | Admit: 2021-10-11 | Discharge: 2021-10-11 | Disposition: A | Discharge status: Home / Self Care | Payer: Medicare Other | Source: Ambulatory Visit | Attending: Orthopedic Surgery | Admitting: Orthopedic Surgery

## 2021-10-11 DIAGNOSIS — M1611 Unilateral primary osteoarthritis, right hip: Secondary | ICD-10-CM | POA: Diagnosis not present

## 2021-10-11 MED ORDER — IOPAMIDOL (ISOVUE-M 200) INJECTION 41%
1.0000 mL | Freq: Once | INTRAMUSCULAR | Status: AC
  Administered 2021-10-11: 1 mL via INTRA_ARTICULAR

## 2021-10-11 MED ORDER — METHYLPREDNISOLONE ACETATE 40 MG/ML INJ SUSP (RADIOLOG
80.0000 mg | Freq: Once | INTRAMUSCULAR | Status: AC
  Administered 2021-10-11: 80 mg via INTRA_ARTICULAR

## 2021-10-15 DIAGNOSIS — M25561 Pain in right knee: Secondary | ICD-10-CM | POA: Diagnosis not present

## 2021-10-18 ENCOUNTER — Other Ambulatory Visit: Payer: Medicare Other

## 2021-10-18 DIAGNOSIS — G4700 Insomnia, unspecified: Secondary | ICD-10-CM | POA: Diagnosis not present

## 2021-10-18 DIAGNOSIS — N390 Urinary tract infection, site not specified: Secondary | ICD-10-CM | POA: Diagnosis not present

## 2021-10-20 DIAGNOSIS — M25561 Pain in right knee: Secondary | ICD-10-CM | POA: Diagnosis not present

## 2021-10-20 DIAGNOSIS — M25661 Stiffness of right knee, not elsewhere classified: Secondary | ICD-10-CM | POA: Diagnosis not present

## 2021-10-22 DIAGNOSIS — M25561 Pain in right knee: Secondary | ICD-10-CM | POA: Diagnosis not present

## 2021-10-22 DIAGNOSIS — M25661 Stiffness of right knee, not elsewhere classified: Secondary | ICD-10-CM | POA: Diagnosis not present

## 2021-10-26 DIAGNOSIS — M25561 Pain in right knee: Secondary | ICD-10-CM | POA: Diagnosis not present

## 2021-10-26 DIAGNOSIS — M25661 Stiffness of right knee, not elsewhere classified: Secondary | ICD-10-CM | POA: Diagnosis not present

## 2021-11-02 DIAGNOSIS — M25661 Stiffness of right knee, not elsewhere classified: Secondary | ICD-10-CM | POA: Diagnosis not present

## 2021-11-02 DIAGNOSIS — M25561 Pain in right knee: Secondary | ICD-10-CM | POA: Diagnosis not present

## 2021-11-04 DIAGNOSIS — M25561 Pain in right knee: Secondary | ICD-10-CM | POA: Diagnosis not present

## 2021-11-04 DIAGNOSIS — M25661 Stiffness of right knee, not elsewhere classified: Secondary | ICD-10-CM | POA: Diagnosis not present

## 2021-11-09 DIAGNOSIS — M25661 Stiffness of right knee, not elsewhere classified: Secondary | ICD-10-CM | POA: Diagnosis not present

## 2021-11-09 DIAGNOSIS — M25561 Pain in right knee: Secondary | ICD-10-CM | POA: Diagnosis not present

## 2021-11-11 DIAGNOSIS — M25561 Pain in right knee: Secondary | ICD-10-CM | POA: Diagnosis not present

## 2021-11-11 DIAGNOSIS — M25661 Stiffness of right knee, not elsewhere classified: Secondary | ICD-10-CM | POA: Diagnosis not present

## 2021-12-03 ENCOUNTER — Other Ambulatory Visit (HOSPITAL_COMMUNITY): Payer: Self-pay

## 2021-12-08 ENCOUNTER — Other Ambulatory Visit: Payer: Self-pay | Admitting: Family Medicine

## 2021-12-08 ENCOUNTER — Other Ambulatory Visit: Payer: Medicare Other

## 2021-12-08 DIAGNOSIS — R0602 Shortness of breath: Secondary | ICD-10-CM

## 2021-12-23 ENCOUNTER — Other Ambulatory Visit (HOSPITAL_COMMUNITY): Payer: Self-pay

## 2021-12-23 MED ORDER — IBUPROFEN 600 MG PO TABS
600.0000 mg | ORAL_TABLET | Freq: Four times a day (QID) | ORAL | 0 refills | Status: AC | PRN
  Filled 2021-12-23: qty 30, 8d supply, fill #0

## 2021-12-23 MED ORDER — AZITHROMYCIN 250 MG PO TABS
ORAL_TABLET | ORAL | 0 refills | Status: AC
  Filled 2021-12-23: qty 6, 5d supply, fill #0

## 2022-01-05 DIAGNOSIS — E7800 Pure hypercholesterolemia, unspecified: Secondary | ICD-10-CM | POA: Diagnosis not present

## 2022-01-05 DIAGNOSIS — Z0000 Encounter for general adult medical examination without abnormal findings: Secondary | ICD-10-CM | POA: Diagnosis not present

## 2022-01-05 DIAGNOSIS — G629 Polyneuropathy, unspecified: Secondary | ICD-10-CM | POA: Diagnosis not present

## 2022-01-05 DIAGNOSIS — I10 Essential (primary) hypertension: Secondary | ICD-10-CM | POA: Diagnosis not present

## 2022-01-12 ENCOUNTER — Other Ambulatory Visit (HOSPITAL_COMMUNITY): Payer: Self-pay

## 2022-01-14 ENCOUNTER — Other Ambulatory Visit (HOSPITAL_COMMUNITY): Payer: Self-pay

## 2022-01-14 MED ORDER — ESTRADIOL 2 MG PO TABS
2.0000 mg | ORAL_TABLET | Freq: Every day | ORAL | 0 refills | Status: AC
  Filled 2022-01-14: qty 90, 90d supply, fill #0

## 2022-01-31 ENCOUNTER — Ambulatory Visit
Admission: RE | Admit: 2022-01-31 | Discharge: 2022-01-31 | Disposition: A | Discharge status: Home / Self Care | Payer: Medicare Other | Source: Ambulatory Visit | Attending: Family Medicine | Admitting: Family Medicine

## 2022-01-31 ENCOUNTER — Other Ambulatory Visit: Payer: Self-pay | Admitting: Family Medicine

## 2022-01-31 DIAGNOSIS — M79642 Pain in left hand: Secondary | ICD-10-CM

## 2022-01-31 DIAGNOSIS — M19042 Primary osteoarthritis, left hand: Secondary | ICD-10-CM | POA: Diagnosis not present

## 2022-03-28 ENCOUNTER — Other Ambulatory Visit: Payer: Self-pay | Admitting: Obstetrics and Gynecology

## 2022-03-28 DIAGNOSIS — Z1231 Encounter for screening mammogram for malignant neoplasm of breast: Secondary | ICD-10-CM

## 2022-04-12 DIAGNOSIS — J9801 Acute bronchospasm: Secondary | ICD-10-CM | POA: Diagnosis not present

## 2022-04-12 DIAGNOSIS — B349 Viral infection, unspecified: Secondary | ICD-10-CM | POA: Diagnosis not present

## 2022-05-13 ENCOUNTER — Ambulatory Visit: Payer: Medicare Other

## 2022-05-18 DIAGNOSIS — R0981 Nasal congestion: Secondary | ICD-10-CM | POA: Diagnosis not present

## 2022-06-15 ENCOUNTER — Ambulatory Visit
Admission: RE | Admit: 2022-06-15 | Discharge: 2022-06-15 | Disposition: A | Discharge status: Home / Self Care | Payer: Medicare HMO | Source: Ambulatory Visit | Attending: Obstetrics and Gynecology | Admitting: Obstetrics and Gynecology

## 2022-06-15 DIAGNOSIS — Z1231 Encounter for screening mammogram for malignant neoplasm of breast: Secondary | ICD-10-CM

## 2022-08-16 DIAGNOSIS — R059 Cough, unspecified: Secondary | ICD-10-CM | POA: Diagnosis not present

## 2022-08-16 DIAGNOSIS — J387 Other diseases of larynx: Secondary | ICD-10-CM | POA: Diagnosis not present

## 2022-08-16 DIAGNOSIS — Z0000 Encounter for general adult medical examination without abnormal findings: Secondary | ICD-10-CM | POA: Diagnosis not present

## 2022-08-16 DIAGNOSIS — K219 Gastro-esophageal reflux disease without esophagitis: Secondary | ICD-10-CM | POA: Diagnosis not present

## 2022-09-07 ENCOUNTER — Ambulatory Visit
Admission: RE | Admit: 2022-09-07 | Discharge: 2022-09-07 | Disposition: A | Discharge status: Home / Self Care | Payer: Medicare HMO | Source: Ambulatory Visit | Attending: Family Medicine | Admitting: Family Medicine

## 2022-09-07 ENCOUNTER — Other Ambulatory Visit: Payer: Self-pay | Admitting: Family Medicine

## 2022-09-07 DIAGNOSIS — R059 Cough, unspecified: Secondary | ICD-10-CM

## 2022-09-07 DIAGNOSIS — R0981 Nasal congestion: Secondary | ICD-10-CM | POA: Diagnosis not present

## 2022-10-20 DIAGNOSIS — Z6837 Body mass index (BMI) 37.0-37.9, adult: Secondary | ICD-10-CM | POA: Diagnosis not present

## 2022-10-20 DIAGNOSIS — N390 Urinary tract infection, site not specified: Secondary | ICD-10-CM | POA: Diagnosis not present

## 2022-10-20 DIAGNOSIS — R3129 Other microscopic hematuria: Secondary | ICD-10-CM | POA: Diagnosis not present

## 2022-10-20 DIAGNOSIS — G4700 Insomnia, unspecified: Secondary | ICD-10-CM | POA: Diagnosis not present

## 2022-10-20 DIAGNOSIS — R32 Unspecified urinary incontinence: Secondary | ICD-10-CM | POA: Diagnosis not present

## 2022-10-20 DIAGNOSIS — Z01419 Encounter for gynecological examination (general) (routine) without abnormal findings: Secondary | ICD-10-CM | POA: Diagnosis not present

## 2022-10-20 DIAGNOSIS — N959 Unspecified menopausal and perimenopausal disorder: Secondary | ICD-10-CM | POA: Diagnosis not present

## 2022-10-24 DIAGNOSIS — R194 Change in bowel habit: Secondary | ICD-10-CM | POA: Diagnosis not present

## 2022-10-24 DIAGNOSIS — K219 Gastro-esophageal reflux disease without esophagitis: Secondary | ICD-10-CM | POA: Diagnosis not present

## 2022-11-08 ENCOUNTER — Encounter: Payer: Self-pay | Admitting: Internal Medicine

## 2022-11-08 ENCOUNTER — Ambulatory Visit: Payer: Medicare HMO | Admitting: Internal Medicine

## 2022-11-08 VITALS — BP 120/80 | HR 67 | Ht 65.5 in | Wt 223.6 lb

## 2022-11-08 DIAGNOSIS — R062 Wheezing: Secondary | ICD-10-CM

## 2022-11-08 DIAGNOSIS — R042 Hemoptysis: Secondary | ICD-10-CM | POA: Diagnosis not present

## 2022-11-08 DIAGNOSIS — R053 Chronic cough: Secondary | ICD-10-CM

## 2022-11-08 LAB — CBC WITH DIFFERENTIAL/PLATELET
Basophils Absolute: 0 10*3/uL (ref 0.0–0.1)
Basophils Relative: 0.8 % (ref 0.0–3.0)
Eosinophils Absolute: 0.3 10*3/uL (ref 0.0–0.7)
Eosinophils Relative: 5.7 % — ABNORMAL HIGH (ref 0.0–5.0)
HCT: 40.6 % (ref 36.0–46.0)
Hemoglobin: 13 g/dL (ref 12.0–15.0)
Lymphocytes Relative: 36.4 % (ref 12.0–46.0)
Lymphs Abs: 2 10*3/uL (ref 0.7–4.0)
MCHC: 32.1 g/dL (ref 30.0–36.0)
MCV: 90.6 fl (ref 78.0–100.0)
Monocytes Absolute: 0.4 10*3/uL (ref 0.1–1.0)
Monocytes Relative: 7.8 % (ref 3.0–12.0)
Neutro Abs: 2.7 10*3/uL (ref 1.4–7.7)
Neutrophils Relative %: 49.3 % (ref 43.0–77.0)
Platelets: 287 10*3/uL (ref 150.0–400.0)
RBC: 4.48 Mil/uL (ref 3.87–5.11)
RDW: 13.7 % (ref 11.5–15.5)
WBC: 5.4 10*3/uL (ref 4.0–10.5)

## 2022-11-08 MED ORDER — ALBUTEROL SULFATE HFA 108 (90 BASE) MCG/ACT IN AERS: 2.0000 | INHALATION_SPRAY | Freq: Four times a day (QID) | RESPIRATORY_TRACT | 2 refills | Status: DC | PRN

## 2022-11-08 MED ORDER — PREDNISONE 10 MG PO TABS: ORAL_TABLET | ORAL | 0 refills | Status: AC

## 2022-11-08 MED ORDER — FLUTICASONE FUROATE-VILANTEROL 100-25 MCG/ACT IN AEPB: 1.0000 | INHALATION_SPRAY | Freq: Every day | RESPIRATORY_TRACT | 3 refills | Status: DC

## 2022-11-08 NOTE — Patient Instructions (Addendum)
ICD-10-CM   1. Chronic cough  R05.3     2. Wheezing  R06.2     3. Hemoptysis  R04.2      I suspect you have clinical asthma and you have wheezing and coughing as a result.  The streaks of blood could also be related to the coughing and the bronchitis. LPR componemnt possible  Plan - Get exhaled nitric oxide testing today in the office -Do CBC with differential, blood IgE and RAST allergy panel -Do pulmonary function test [first available ideally within 3 weeks even in the hospital] - Take prednisone 40 mg daily x 2 days, then 20mg  daily x 2 days, then 10mg  daily x 2 days, then 5mg  daily x 2 days and stop -Start inhaler Breo high-dose 1 puff once daily -Start albuterol as needed - avoid vaccines til at least nex visit ]  Follow-up - Return in 3 weeks to see nurse practitioner to discuss results and clinical status  -Ideally get eating test but if it is not possible then just come and see the nurse practitioner

## 2022-11-08 NOTE — Progress Notes (Signed)
OV 11/08/2022  Subjective:  [name], female , DOB: Dec 13, 1945 , age 77 y.o. , MRN: [id number] , ADDRESS: [address] [place] [address] PCP Ollen Bowl, MD Patient Care Team: Ollen Bowl, MD as PCP - General (Internal Medicine)  This Provider for this visit: Treatment Team:  Attending Provider: Kalman Shan, MD    11/08/2022 -   Chief Complaint  Patient presents with   Consult    Consult on coughing up phlegm, wheezing, and reddish brown mucus     HPI Ashley Knapp 77 y.o. -as a new consult.  She is to work in [employer] and retired in January 2024 after 36 years of service.  She says sometime the early part of the year she started noticing nocturnal wheezing when she was sleeping.  She did have reassurance from her primary care physician to travel abroad but upon coming back she still had symptoms and was diagnosed with bronchospasm and sinusitis.  But then she started also having cough and then by the summer 2024 is diagnosed with LPR cough.  No history of treatment with inhalers.  Symptoms are progressive.  There is associated sputum that is yellow and clear color.  She coughs first time in the morning and also when she goes to bed at night.  She did show audio recording of the wheezing.  And she has significant wheezing at this time.  Terms are made worse by heat and improved by sips of water and also chewing gum.  Not improved at all.  She does have nocturnal awakenings at least 2/month.  There is no family history of asthma.   She is actively wheezing and I advised her to avoid of vaccines at this point.  On macrobid but she says is prn  She had a chest x-ray 09/07/2022: Personally visualized and it is clear.  She had detailed food allergy blood testing on November 02, 2016 and this is normal.  She had a maxillofacial CT sinus January 2016: And is normal.  PFT  FENO 11/08/2022= - COULD NOT DO      No data  to display            LAB RESULTS last 96 hours No results found.  LAB RESULTS last 90 days No results found for this or any previous visit (from the past 2160 hour(s)).       has a past medical history of Allergy, Lip swelling, Multiple food allergies, and Urticaria.   reports that she has never smoked. She has never used smokeless tobacco.  Past Surgical History:  Procedure Laterality Date   ABDOMINAL HYSTERECTOMY     HERNIA REPAIR     REDUCTION MAMMAPLASTY Bilateral    rotator cuff      Allergies  Allergen Reactions   Lisinopril Swelling    Lip swelling    Fosamax [Alendronate Sodium] Other (See Comments)    Chest pain     Immunization History  Administered Date(s) Administered   PFIZER Comirnaty(Gray Top)Covid-19 Tri-Sucrose Vaccine 07/01/2020   PFIZER(Purple Top)SARS-COV-2 Vaccination 12/18/2019    Family History  Problem Relation Age of Onset   Diabetes Mother    Hypertension Mother    Heart disease Mother    Kidney failure Mother    Emphysema Father    Diverticulitis Father    Breast cancer Maternal Aunt    Breast cancer Cousin      Current Outpatient Medications:    aspirin 81  MG tablet, Take 81 mg by mouth daily., Disp: , Rfl:    Calcium Carbonate-Vit D-Min (CALCIUM 1200 PO), Take by mouth., Disp: , Rfl:    Cholecalciferol (VITAMIN D3) 1000 UNITS CAPS, Take by mouth., Disp: , Rfl:    DiphenhydrAMINE HCl (BENADRYL PO), Take by mouth., Disp: , Rfl:    estradiol (ESTRACE) 2 MG tablet, Take 1 tablet (2 mg total) by mouth daily., Disp: 90 tablet, Rfl: 0   fluconazole (DIFLUCAN) 150 MG tablet, Take one tablet daily until gone., Disp: 3 tablet, Rfl: 0   fluorometholone (FML) 0.1 % ophthalmic suspension, , Disp: , Rfl:    ibuprofen (ADVIL) 600 MG tablet, Take 1 tablet (600 mg total) by mouth every 6 (six) hours as needed for pain., Disp: 30 tablet, Rfl: 0   Magnesium 250 MG TABS, Take by mouth., Disp: , Rfl:    metoprolol succinate (TOPROL-XL) 25  MG 24 hr tablet, Take 1 tablet by mouth once a day for blood pressure, Disp: 90 tablet, Rfl: 4   nitrofurantoin, macrocrystal-monohydrate, (MACROBID) 100 MG capsule, Take one capsule by mouth once daily as directed, Disp: 90 capsule, Rfl: PRN   Olopatadine HCl (PAZEO) 0.7 % SOLN, Place 1 drop into both eyes daily., Disp: 1 Bottle, Rfl: 5   pantoprazole (PROTONIX) 40 MG tablet, Take 40 mg by mouth 2 (two) times daily., Disp: , Rfl:    rosuvastatin (CRESTOR) 5 MG tablet, Take 1 tablet by mouth once a day for cholesterol, Disp: 90 tablet, Rfl: 3   zolpidem (AMBIEN CR) 12.5 MG CR tablet, Take one tablet by mouth every day at bedtime., Disp: 60 tablet, Rfl: 5      Objective:   Vitals:   11/08/22 1314  BP: 120/80  Pulse: 67  SpO2: 96%  Weight: 223 lb 9.6 oz (101.4 kg)  Height: 5' 5.5" (1.664 m)    Estimated body mass index is 36.64 kg/m as calculated from the following:   Height as of this encounter: 5' 5.5" (1.664 m).   Weight as of this encounter: 223 lb 9.6 oz (101.4 kg).  @WEIGHTCHANGE @  [employer]   11/08/22 1314  Weight: 223 lb 9.6 oz (101.4 kg)     Physical Exam   General: No distress. Looks well O2 at rest: no Cane present: no Sitting in wheel chair: no Frail: no Obese: yes Neuro: Alert and Oriented x 3. GCS 15. Speech normal Psych: Pleasant Resp:  Barrel Chest - no.  Wheeze - YES, Crackles - no, No overt respiratory distress CVS: Normal heart sounds. Murmurs - no Ext: Stigmata of Connective Tissue Disease - no HEENT: Normal upper airway. PEERL +. No post nasal drip        Assessment:       ICD-10-CM   1. Chronic cough  R05.3     2. Wheezing  R06.2     3. Hemoptysis  R04.2          Plan:     Patient Instructions     ICD-10-CM   1. Chronic cough  R05.3     2. Wheezing  R06.2     3. Hemoptysis  R04.2      I suspect you have clinical asthma and you have wheezing and coughing as a result.  The streaks of blood could also be related to the  coughing and the bronchitis. LPR componemnt possible  Plan - Get exhaled nitric oxide testing today in the office -Do CBC with differential, blood IgE and RAST allergy panel -  Do pulmonary function test [first available ideally within 3 weeks even in the hospital] - Take prednisone 40 mg daily x 2 days, then 20mg  daily x 2 days, then 10mg  daily x 2 days, then 5mg  daily x 2 days and stop -Start inhaler Breo high-dose 1 puff once daily -Start albuterol as needed - avoid vaccines til at least nex visit ]  Follow-up - Return in 3 weeks to see nurse practitioner to discuss results and clinical status  -Ideally get eating test but if it is not possible then just come and see the nurse practitioner   FOLLOWUP Return in about 3 weeks (around 11/29/2022), or if symptoms worsen or fail to improve, for Face to Face OR Video Visit, with any of the APPS.    SIGNATURE    Dr. Kalman Shan, M.D., F.C.C.P,  Pulmonary and Critical Care Medicine Staff Physician, [facility] Center Director - Interstitial Lung Disease  Program  Pulmonary Fibrosis [facility] Network at [facility] [place], [place], [address]  Pager: [phone], If no answer or between  15:00h - 7:00h: call [phone] Telephone: [phone]  1:36 PM 11/08/2022

## 2022-11-09 LAB — IGE: IgE (Immunoglobulin E), Serum: 457 kU/L — ABNORMAL HIGH (ref <=114)

## 2022-11-10 ENCOUNTER — Telehealth: Payer: Self-pay | Admitting: Internal Medicine

## 2022-11-10 NOTE — Telephone Encounter (Signed)
    IgE and Eos are high - so featureas are all c/w asthma. Await allergy labs. Will not be calling with results. Can be discussed 11/29/22

## 2022-11-11 LAB — ALLERGEN PROFILE, PERENNIAL ALLERGEN IGE
Alternaria Alternata IgE: 1.36 kU/L — AB
Aspergillus Fumigatus IgE: 0.22 kU/L — AB
Aureobasidi Pullulans IgE: 0.18 kU/L — AB
Candida Albicans IgE: 0.23 kU/L — AB
Cat Dander IgE: 0.61 kU/L — AB
Chicken Feathers IgE: <0.1 kU/L
Cladosporium Herbarum IgE: 0.11 kU/L — AB
Cow Dander IgE: <0.1 kU/L
D Farinae IgE: <0.1 kU/L
D Pteronyssinus IgE: 0.19 kU/L — AB
Dog Dander IgE: 0.24 kU/L — AB
Duck Feathers IgE: <0.1 kU/L
Goose Feathers IgE: <0.1 kU/L
Mouse Urine IgE: <0.1 kU/L
Mucor Racemosus IgE: <0.1 kU/L
Penicillium Chrysogen IgE: 0.11 kU/L — AB
Phoma Betae IgE: 0.44 kU/L — AB
Setomelanomma Rostrat: 0.6 kU/L — AB
Stemphylium Herbarum IgE: 0.81 kU/L — AB

## 2022-11-14 NOTE — Progress Notes (Signed)
Blood allergy test strongly positive for several things in the environment.  Will discuss this at follow-up.  All consistent with asthma.  We will not be calling with this test result.

## 2022-11-18 ENCOUNTER — Telehealth: Payer: Self-pay | Admitting: Internal Medicine

## 2022-11-18 NOTE — Telephone Encounter (Signed)
Calling to get scheduled for breathing test at the hospital

## 2022-11-24 NOTE — Telephone Encounter (Signed)
Holly lefta message on 11/08/2022 and I left another message today

## 2022-11-28 ENCOUNTER — Ambulatory Visit (HOSPITAL_COMMUNITY)
Admission: RE | Admit: 2022-11-28 | Discharge: 2022-11-28 | Disposition: A | Discharge status: Home / Self Care | Payer: Medicare HMO | Source: Ambulatory Visit | Attending: Internal Medicine | Admitting: Internal Medicine

## 2022-11-28 DIAGNOSIS — R053 Chronic cough: Secondary | ICD-10-CM | POA: Diagnosis present

## 2022-11-28 LAB — PULMONARY FUNCTION TEST
DL/VA % pred: 113 %
DL/VA: 4.62 ml/min/mmHg/L
DLCO cor % pred: 94 %
DLCO cor: 18.96 ml/min/mmHg
DLCO unc % pred: 94 %
DLCO unc: 18.96 ml/min/mmHg
FEF 25-75 Post: 2.86 L/s
FEF 25-75 Pre: 2.89 L/s
FEF2575-%Change-Post: -1 %
FEF2575-%Pred-Post: 163 %
FEF2575-%Pred-Pre: 165 %
FEV1-%Change-Post: 0 %
FEV1-%Pred-Post: 94 %
FEV1-%Pred-Pre: 94 %
FEV1-Post: 2.14 L
FEV1-Pre: 2.12 L
FEV1FVC-%Change-Post: 0 %
FEV1FVC-%Pred-Pre: 116 %
FEV6-%Change-Post: 0 %
FEV6-%Pred-Post: 85 %
FEV6-%Pred-Pre: 84 %
FEV6-Post: 2.45 L
FEV6-Pre: 2.43 L
FEV6FVC-%Pred-Post: 105 %
FEV6FVC-%Pred-Pre: 105 %
FVC-%Change-Post: 0 %
FVC-%Pred-Post: 81 %
FVC-%Pred-Pre: 80 %
FVC-Post: 2.45 L
FVC-Pre: 2.43 L
Post FEV1/FVC ratio: 87 %
Post FEV6/FVC ratio: 100 %
Pre FEV1/FVC ratio: 87 %
Pre FEV6/FVC Ratio: 100 %
RV % pred: 118 %
RV: 2.8 L
TLC % pred: 105 %
TLC: 5.57 L

## 2022-11-28 MED ORDER — ALBUTEROL SULFATE (2.5 MG/3ML) 0.083% IN NEBU
2.5000 mg | INHALATION_SOLUTION | Freq: Once | RESPIRATORY_TRACT | Status: AC
Start: 2022-11-28 — End: 2022-11-28
  Administered 2022-11-28: 2.5 mg via RESPIRATORY_TRACT

## 2022-11-29 ENCOUNTER — Ambulatory Visit: Payer: Medicare HMO | Admitting: Adult Health

## 2022-11-29 ENCOUNTER — Encounter: Payer: Self-pay | Admitting: Adult Health

## 2022-11-29 VITALS — BP 130/80 | HR 92 | Temp 97.8°F | Ht 65.5 in | Wt 223.0 lb

## 2022-11-29 DIAGNOSIS — J45909 Unspecified asthma, uncomplicated: Secondary | ICD-10-CM | POA: Insufficient documentation

## 2022-11-29 DIAGNOSIS — J4530 Mild persistent asthma, uncomplicated: Secondary | ICD-10-CM | POA: Diagnosis not present

## 2022-11-29 MED ORDER — MONTELUKAST SODIUM 10 MG PO TABS: 10.0000 mg | ORAL_TABLET | Freq: Every day | ORAL | 3 refills | Status: DC

## 2022-11-29 NOTE — Patient Instructions (Addendum)
Remain on Breo 1 puff daily, rinse after use  Albuterol inhaler As needed   Add Singulair 10mg  At bedtime   Add Allegra 180mg  daily  Continue on Protonix Twice daily   Asthma action plan as discussed  Follow up with Dr. Marchelle Gearing in 3 months and As needed

## 2022-11-29 NOTE — Progress Notes (Signed)
@Patient  ID: Ashley Knapp, female    DOB: 10/13/1945, 77 y.o.   MRN: [id number]  Chief Complaint  Patient presents with   Follow-up    Referring provider: Ollen Bowl, MD  HPI: 77 year old female never smoker seen for pulmonary consult November 08, 2022 for cough and wheezing  TEST/EVENTS :   11/29/2022 Follow up : Cough and Wheezing  [name]  [name]  Was felt to have a component of asthma.  She was started on Breo.  And given a prednisone taper for ongoing wheezing.  Also treated for possible GERD component. Lab work showed elevated IgE at 457, allergy panel was positive for dust, cat and dog dander, Aspergillus, mold.  Absolute eosinophils were elevated at 300.  PFTs done yesterday showed normal lung function with FEV1 at 94%, ratio 87, FVC 81%, no significant bronchodilator response, DLCO was 94%. History of seasonal allergies . No previous diagnosis of Asthma.  Not taking any allergy medicines.  Has no pets. No carpet. Same home for last 20yr.. No basement . No obvious mold.  Had Covid 19 infection in 2021 after vaccine. Only mild symptoms.  Since last is better with decreased cough and wheezing . Wheezing has resolved. No albuterol use.  Remains on PPI on Twice daily  .    Allergies  Allergen Reactions   Lisinopril Swelling    Lip swelling    Cephalexin Itching   Fosamax [Alendronate Sodium] Other (See Comments)    Chest pain    Triamterene-Hctz Other (See Comments)    Raises creatinine.    Immunization History  Administered Date(s) Administered   PFIZER Comirnaty(Gray Top)Covid-19 Tri-Sucrose Vaccine 07/01/2020   PFIZER(Purple Top)SARS-COV-2 Vaccination 12/18/2019    Past Medical History:  Diagnosis Date   Allergy    Lip swelling    Multiple food allergies    Urticaria     Tobacco History: Social History    Tobacco Use  Smoking Status Never  Smokeless Tobacco Never   Counseling given: Not Answered   Outpatient Medications Prior to Visit  Medication Sig Dispense Refill   albuterol (VENTOLIN HFA) 108 (90 Base) MCG/ACT inhaler Inhale 2 puffs into the lungs every 6 (six) hours as needed for wheezing or shortness of breath. 8 g 2   aspirin 81 MG tablet Take 81 mg by mouth daily.     Calcium Carbonate-Vit D-Min (CALCIUM 1200 PO) Take by mouth.     DiphenhydrAMINE HCl (BENADRYL PO) Take 1 tablet by mouth every 6 (six) hours as needed.     estradiol (ESTRACE) 2 MG tablet Take 1 tablet (2 mg total) by mouth daily. 90 tablet 0   fluticasone furoate-vilanterol (BREO ELLIPTA) 100-25 MCG/ACT AEPB Inhale 1 puff into the lungs daily. 60 each 3   ibuprofen (ADVIL) 600 MG tablet Take 1 tablet (600 mg total) by mouth every 6 (six) hours as needed for pain. 30 tablet 0   Magnesium 250 MG TABS Take by mouth.     metoprolol succinate (TOPROL-XL) 25 MG 24 hr tablet Take 1 tablet by mouth once a day for blood pressure 90 tablet 4   nitrofurantoin, macrocrystal-monohydrate, (MACROBID) 100 MG capsule Take one capsule by mouth once daily as directed 90 capsule PRN   Olopatadine HCl (PAZEO) 0.7 % SOLN Place 1 drop into both eyes daily. 1 Bottle 5   pantoprazole (PROTONIX) 40 MG tablet Take  40 mg by mouth 2 (two) times daily.     rosuvastatin (CRESTOR) 5 MG tablet Take 1 tablet by mouth once a day for cholesterol 90 tablet 3   zolpidem (AMBIEN CR) 12.5 MG CR tablet Take one tablet by mouth every day at bedtime. 60 tablet 5   Cholecalciferol (VITAMIN D3) 1000 UNITS CAPS Take by mouth. (Patient not taking: Reported on 11/29/2022)     fluconazole (DIFLUCAN) 150 MG tablet Take one tablet daily until gone. (Patient not taking: Reported on 11/29/2022) 3 tablet 0   fluorometholone (FML) 0.1 % ophthalmic suspension  (Patient not taking: Reported on 11/29/2022)     No facility-administered medications prior to visit.      Review of Systems:   Constitutional:   No  weight loss, night sweats,  Fevers, chills, fatigue, or  lassitude.  HEENT:   No headaches,  Difficulty swallowing,  Tooth/dental problems, or  Sore throat,                No sneezing, itching, ear ache, nasal congestion, post nasal drip,   CV:  No chest pain,  Orthopnea, PND, swelling in lower extremities, anasarca, dizziness, palpitations, syncope.   GI  No heartburn, indigestion, abdominal pain, nausea, vomiting, diarrhea, change in bowel habits, loss of appetite, bloody stools.   Resp: No shortness of breath with exertion or at rest.  No excess mucus, no productive cough,  No non-productive cough,  No coughing up of blood.  No change in color of mucus.  No wheezing.  No chest wall deformity  Skin: no rash or lesions.  GU: no dysuria, change in color of urine, no urgency or frequency.  No flank pain, no hematuria   MS:  No joint pain or swelling.  No decreased range of motion.  No back pain.    Physical Exam  BP 130/80 (BP Location: Left Arm, Patient Position: Sitting, Cuff Size: Large)   Pulse 92   Temp 97.8 F (36.6 C) (Oral)   Ht 5' 5.5" (1.664 m)   Wt 223 lb (101.2 kg)   SpO2 97%   BMI 36.54 kg/m   GEN: A/Ox3; pleasant , NAD, well nourished    HEENT:  [place]/AT,   NOSE-clear, THROAT-clear, no lesions, no postnasal drip or exudate noted.   NECK:  Supple w/ fair ROM; no JVD; normal carotid impulses w/o bruits; no thyromegaly or nodules palpated; no lymphadenopathy.    RESP  Clear  P & A; w/o, wheezes/ rales/ or rhonchi. no accessory muscle use, no dullness to percussion  CARD:  RRR, no m/r/g, no peripheral edema, pulses intact, no cyanosis or clubbing.  GI:   Soft & nt; nml bowel sounds; no organomegaly or masses detected.   Musco: Warm bil, no deformities or joint swelling noted.   Neuro: alert, no focal deficits noted.    Skin: Warm, no lesions or rashes    Lab Results:  CBC    Component Value Date/Time    WBC 5.4 11/08/2022 1349   RBC 4.48 11/08/2022 1349   HGB 13.0 11/08/2022 1349   HCT 40.6 11/08/2022 1349   PLT 287.0 11/08/2022 1349   MCV 90.6 11/08/2022 1349   MCHC 32.1 11/08/2022 1349   RDW 13.7 11/08/2022 1349   LYMPHSABS 2.0 11/08/2022 1349   MONOABS 0.4 11/08/2022 1349   EOSABS 0.3 11/08/2022 1349   BASOSABS 0.0 11/08/2022 1349    BMET No results found for: "NA", "K", "CL", "CO2", "GLUCOSE", "BUN", "CREATININE", "CALCIUM", "GFRNONAA", "GFRAA"  BNP  No results found for: "BNP"  ProBNP No results found for: "PROBNP"  Imaging: No results found.  Administration History     None          Latest Ref Rng & Units 11/28/2022    2:20 PM  PFT Results  FVC-Pre L 2.43   FVC-Predicted Pre % 80   FVC-Post L 2.45   FVC-Predicted Post % 81   Pre FEV1/FVC % % 87   Post FEV1/FCV % % 87   FEV1-Pre L 2.12   FEV1-Predicted Pre % 94   FEV1-Post L 2.14   DLCO uncorrected ml/min/mmHg 18.96   DLCO UNC% % 94   DLCO corrected ml/min/mmHg 18.96   DLCO COR %Predicted % 94   DLVA Predicted % 113   TLC L 5.57   TLC % Predicted % 105   RV % Predicted % 118     No results found for: "NITRICOXIDE"      Assessment & Plan:   Asthma Mild persistent asthma with allergic phenotype-PFTs showed normal lung function.  Lab work showed an allergic response with elevated IgE, eosinophils and allergen triggers for dust, pet dander, mold.  Patient has had improvement after steroid taper and beginning Breo.  Will add in Singulair and Allegra to control for allergen triggers.  On return visit may be able to de-escalate maintenance regimen if continues to do well.  Would continue on PPI therapy for LPR.  Plan  Patient Instructions  Remain on Breo 1 puff daily, rinse after use  Albuterol inhaler As needed   Add Singulair 10mg  At bedtime   Add Allegra 180mg  daily  Continue on Protonix Twice daily   Asthma action plan as discussed  Follow up with Dr. Marchelle Gearing in 3 months and As needed          I spent 30   minutes dedicated to the care of this patient on the date of this encounter to include pre-visit review of records, face-to-face time with the patient discussing conditions above, post visit ordering of testing, clinical documentation with the electronic health record, making appropriate referrals as documented, and communicating necessary findings to members of the patients care team.   Rubye Oaks, NP 11/29/2022

## 2022-11-29 NOTE — Assessment & Plan Note (Signed)
Mild persistent asthma with allergic phenotype-PFTs showed normal lung function.  Lab work showed an allergic response with elevated IgE, eosinophils and allergen triggers for dust, pet dander, mold.  Patient has had improvement after steroid taper and beginning Breo.  Will add in Singulair and Allegra to control for allergen triggers.  On return visit may be able to de-escalate maintenance regimen if continues to do well.  Would continue on PPI therapy for LPR.  Plan  Patient Instructions  Remain on Breo 1 puff daily, rinse after use  Albuterol inhaler As needed   Add Singulair 10mg  At bedtime   Add Allegra 180mg  daily  Continue on Protonix Twice daily   Asthma action plan as discussed  Follow up with Dr. Marchelle Gearing in 3 months and As needed

## 2023-01-09 DIAGNOSIS — I10 Essential (primary) hypertension: Secondary | ICD-10-CM | POA: Diagnosis not present

## 2023-01-09 DIAGNOSIS — E7800 Pure hypercholesterolemia, unspecified: Secondary | ICD-10-CM | POA: Diagnosis not present

## 2023-01-09 DIAGNOSIS — N1831 Chronic kidney disease, stage 3a: Secondary | ICD-10-CM | POA: Diagnosis not present

## 2023-01-18 IMAGING — XA Imaging study
2 series · 2 of 2 positions shown · non-contrast
Comparison: none

CLINICAL DATA: Lumbosacral spondylosis without myelopathy. Right
lower extremity numbness. Occasional low back pain. Advanced spinal
stenosis at L4-5 and neural foraminal stenosis at L4-5 and L5-S1.

[Series 1: ortho standard · 1 of 1 slices shown (1 of 2)]
[im 1/1]
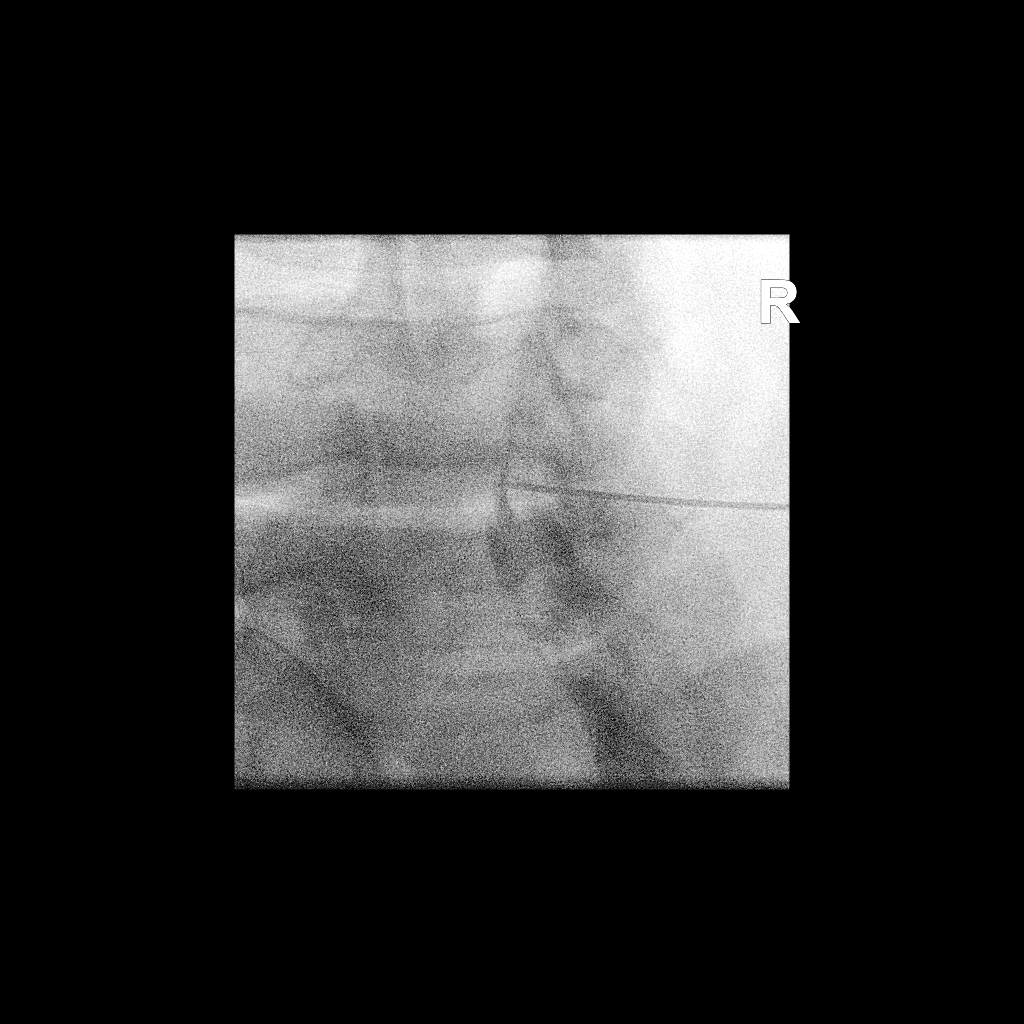

[Series 2: ortho standard · 1 of 1 slices shown (2 of 2)]
[im 1/1]
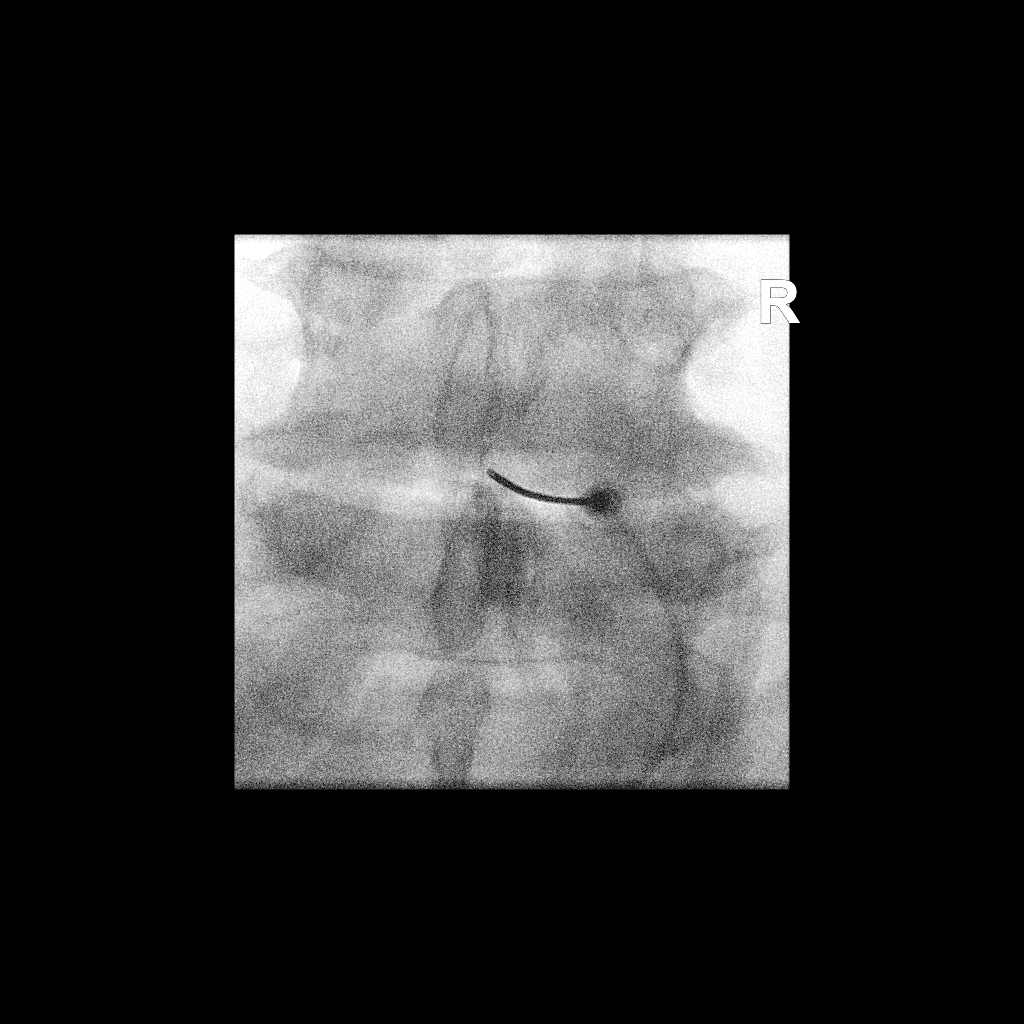

[2 of 2 positions shown; findings below may reference images not displayed]

FLUOROSCOPY:
Radiation Exposure Index (as provided by the fluoroscopic device):
1.80 mGy Kerma

PROCEDURE:
The procedure, risks, benefits, and alternatives were explained to
the patient. Questions regarding the procedure were encouraged and
answered. The patient understands and consents to the procedure.

LUMBAR EPIDURAL INJECTION:

An interlaminar approach was performed on the right at L4-5. The
overlying skin was cleansed and anesthetized. A 3.5 inch 20 gauge
epidural needle was advanced using loss-of-resistance technique.

DIAGNOSTIC EPIDURAL INJECTION:

Injection of Isovue-M 200 shows a good epidural pattern with spread
above and below the level of needle placement, primarily on the
right. No vascular opacification is seen.

THERAPEUTIC EPIDURAL INJECTION:

80 mg of Depo-Medrol mixed with 3 mL of 1% lidocaine were instilled.
The procedure was well-tolerated, and the patient was discharged
thirty minutes following the injection in good condition.

COMPLICATIONS:
None immediate
IMPRESSION: Technically successful interlaminar epidural injection on the right
at L4-5.

## 2023-01-31 ENCOUNTER — Other Ambulatory Visit: Payer: Self-pay | Admitting: Orthopedic Surgery

## 2023-01-31 DIAGNOSIS — N958 Other specified menopausal and perimenopausal disorders: Secondary | ICD-10-CM | POA: Diagnosis not present

## 2023-01-31 DIAGNOSIS — M1611 Unilateral primary osteoarthritis, right hip: Secondary | ICD-10-CM

## 2023-02-02 DIAGNOSIS — R0981 Nasal congestion: Secondary | ICD-10-CM | POA: Diagnosis not present

## 2023-02-02 DIAGNOSIS — R051 Acute cough: Secondary | ICD-10-CM | POA: Diagnosis not present

## 2023-02-02 DIAGNOSIS — U071 COVID-19: Secondary | ICD-10-CM | POA: Diagnosis not present

## 2023-02-14 ENCOUNTER — Ambulatory Visit
Admission: RE | Admit: 2023-02-14 | Discharge: 2023-02-14 | Disposition: A | Discharge status: Home / Self Care | Payer: Medicare HMO | Source: Ambulatory Visit | Attending: Orthopedic Surgery | Admitting: Orthopedic Surgery

## 2023-02-14 DIAGNOSIS — M1611 Unilateral primary osteoarthritis, right hip: Secondary | ICD-10-CM | POA: Diagnosis not present

## 2023-02-14 DIAGNOSIS — N179 Acute kidney failure, unspecified: Secondary | ICD-10-CM | POA: Diagnosis not present

## 2023-02-14 MED ORDER — IOPAMIDOL (ISOVUE-M 200) INJECTION 41%
1.0000 mL | Freq: Once | INTRAMUSCULAR | Status: AC
  Administered 2023-02-14: 1 mL via INTRA_ARTICULAR

## 2023-02-14 MED ORDER — METHYLPREDNISOLONE ACETATE 40 MG/ML INJ SUSP (RADIOLOG
80.0000 mg | Freq: Once | INTRAMUSCULAR | Status: AC
  Administered 2023-02-14: 80 mg via INTRA_ARTICULAR

## 2023-02-23 ENCOUNTER — Other Ambulatory Visit: Payer: Self-pay | Admitting: Internal Medicine

## 2023-03-13 ENCOUNTER — Ambulatory Visit: Payer: Medicare Other | Admitting: Internal Medicine

## 2023-03-13 ENCOUNTER — Telehealth: Payer: Self-pay

## 2023-03-13 ENCOUNTER — Encounter: Payer: Self-pay | Admitting: Internal Medicine

## 2023-03-13 VITALS — BP 146/86 | HR 82 | Ht 66.0 in | Wt 225.0 lb

## 2023-03-13 DIAGNOSIS — J4540 Moderate persistent asthma, uncomplicated: Secondary | ICD-10-CM

## 2023-03-13 DIAGNOSIS — Z7185 Encounter for immunization safety counseling: Secondary | ICD-10-CM

## 2023-03-13 MED ORDER — BREO ELLIPTA 100-25 MCG/ACT IN AEPB: 1.0000 | INHALATION_SPRAY | Freq: Every day | RESPIRATORY_TRACT | 4 refills | Status: DC

## 2023-03-13 NOTE — Patient Instructions (Addendum)
 ICD-10-CM   1. Asthma, well controlled, moderate persistent  J45.40     2. Moderate persistent extrinsic asthma without complication  J45.40     3. Vaccine counseling  Z71.85       Well controlled Small amount of mucus that is not an inconvenience Doing well overall $45 per month noted on breo PFT normal sept  2025  PLAN - Remain on Breo 1 puff daily, rinse after use   = will see if we can do 90 day supply ; ask your pharmacy as wel Albuterol  inhaler As needed   Continue Singulair  10mg  At bedtime   Change  Allegra 180mg  daily to as needed and see if things get worse Continue on Protonix Twice daily  and try once daily  Vaccines  - recommend flu shot, RSV shot and pneumonia vaccine  ASAP  - Please talk to PCP Pahwani, Velna SAUNDERS, MD -  and ensure you get  shingrix (GSK) inactivated vaccine against shingles    Follow up with  - Dr. Geronimo in 6 months and As needed

## 2023-03-13 NOTE — Progress Notes (Signed)
 HPI  never smoker seen for pulmonary consult November 08, 2022 for cough and wheezing  Ashley Knapp 77 y.o. -as a new consult.  She is to work in [employer] and retired in January 2024 after 36 years of service.  She says sometime the early part of the year she started noticing nocturnal wheezing when she was sleeping.  She did have reassurance from her primary care physician to travel abroad but upon coming back she still had symptoms and was diagnosed with bronchospasm and sinusitis.  But then she started also having cough and then by the summer 2024 is diagnosed with LPR cough.  No history of treatment with inhalers.  Symptoms are progressive.  There is associated sputum that is yellow and clear color.  She coughs first time in the morning and also when she goes to bed at night.  She did show audio recording of the wheezing.  And she has significant wheezing at this time.  Terms are made worse by heat and improved by sips of water and also chewing gum.  Not improved at all.  She does have nocturnal awakenings at least 2/month.  There is no family history of asthma.   She is actively wheezing and I advised her to avoid of vaccines at this point.  On macrobid  but she says is prn  She had a chest x-ray 09/07/2022: Personally visualized and it is clear.  She had detailed food allergy blood testing on November 02, 2016 and this is normal.  She had a maxillofacial CT sinus January 2016: And is normal.  PFT  FENO 11/08/2022= - COULD NOT DO  44-year-old female never smoker seen for pulmonary consult November 08, 2022 for cough and wheezing  TEST/EVENTS :   11/29/2022 Follow up : Cough and Wheezing  [name]  [name]  Was felt to have a component of asthma.  She was started on Breo.  And given a prednisone  taper for ongoing wheezing.  Also  treated for possible GERD component. Lab work showed elevated IgE at 457, allergy panel was positive for dust, cat and dog dander, Aspergillus, mold.  Absolute eosinophils were elevated at 300.  PFTs done yesterday showed normal lung function with FEV1 at 94%, ratio 87, FVC 81%, no significant bronchodilator response, DLCO was 94%. History of seasonal allergies . No previous diagnosis of Asthma.  Not taking any allergy medicines.  Has no pets. No carpet. Same home for last 40yr.. No basement . No obvious mold.  Had Covid 19 infection in 2021 after vaccine. Only mild symptoms.  Since last is better with decreased cough and wheezing . Wheezing has resolved. No albuterol  use.  Remains on PPI on Twice daily  .   OV 03/13/2023  Subjective:  [name], female , DOB: 1946/02/25 , age 67 y.o. , MRN: [id number] , ADDRESS: [address] [place] [address] PCP Vernon Velna SAUNDERS, MD Patient Care Team: Vernon Velna SAUNDERS, MD as PCP - General (Internal Medicine)  This Provider for this visit: Treatment Team:  Attending Provider: Geronimo Amel, MD    03/13/2023 -   Chief Complaint  Patient presents with   Follow-up    Pt states her cough has subsided, denies concerns, pft f/u      HPI Ashley Knapp 78 y.o. -returns for follow-up.  After I saw  her and diagnosed as asthma she saw a nurse practitioner Tammy who added Singulair  and Allegra.  After that she is much better.  She has no nocturnal awakenings no shortness of breath no wheezing no albuterol  use but she does have small amount of mucus that she says is mild particularly early in the morning.  Or when she lies down.  She did have COVID around Thanksgiving but recovered well from that.  She did indicate the Breo was expensive at $45 per month which I did tell her it was reasonable but we did try to do a 90-day prescription for her to see if we can reduce cost.  Otherwise she is doing well.  She says she had a flu shot  although does not like documented clearly.  I recommended other vaccines for her.    PFT     Latest Ref Rng & Units 11/28/2022    2:20 PM  ILD indicators  FVC-Pre L 2.43   FVC-Predicted Pre % 80   FVC-Post L 2.45   FVC-Predicted Post % 81   TLC L 5.57   TLC Predicted % 105   DLCO uncorrected ml/min/mmHg 18.96   DLCO UNC %Pred % 94   DLCO Corrected ml/min/mmHg 18.96   DLCO COR %Pred % 94       LAB RESULTS last 96 hours No results found.  LAB RESULTS last 90 days No results found for this or any previous visit (from the past 2160 hours).       has a past medical history of Allergy, Lip swelling, Multiple food allergies, and Urticaria.   reports that she has never smoked. She has never used smokeless tobacco.  Past Surgical History:  Procedure Laterality Date   ABDOMINAL HYSTERECTOMY     HERNIA REPAIR     REDUCTION MAMMAPLASTY Bilateral    rotator cuff      Allergies  Allergen Reactions   Lisinopril Swelling    Lip swelling    Cephalexin  Itching   Fosamax [Alendronate Sodium] Other (See Comments)    Chest pain    Triamterene -Hctz Other (See Comments)    Raises creatinine.    Immunization History  Administered Date(s) Administered   PFIZER Comirnaty(Gray Top)Covid-19 Tri-Sucrose Vaccine 07/01/2020   PFIZER(Purple Top)SARS-COV-2 Vaccination 12/18/2019    Family History  Problem Relation Age of Onset   Diabetes Mother    Hypertension Mother    Heart disease Mother    Kidney failure Mother    Emphysema Father    Diverticulitis Father    Breast cancer Maternal Aunt    Breast cancer Cousin      Current Outpatient Medications:    albuterol  (VENTOLIN  HFA) 108 (90 Base) MCG/ACT inhaler, Inhale 2 puffs into the lungs every 6 (six) hours as needed for wheezing or shortness of breath., Disp: 8 g, Rfl: 2   aspirin 81 MG tablet, Take 81 mg by mouth daily., Disp: , Rfl:    BREO ELLIPTA  100-25 MCG/ACT AEPB, TAKE 1 PUFF BY MOUTH EVERY DAY, Disp: 60 each,  Rfl: 3   BREO ELLIPTA  100-25 MCG/ACT AEPB, Inhale 1 puff into the lungs daily., Disp: 90 each, Rfl: 4   Calcium  Carbonate-Vit D-Min (CALCIUM  1200 PO), Take by mouth., Disp: , Rfl:    DiphenhydrAMINE HCl (BENADRYL PO), Take 1 tablet by mouth every 6 (six) hours as needed., Disp: , Rfl:    estradiol  (ESTRACE ) 2 MG tablet, Take 1 tablet (2 mg total) by mouth daily., Disp: 90 tablet, Rfl: 0  ibuprofen  (ADVIL ) 600 MG tablet, Take 1 tablet (600 mg total) by mouth every 6 (six) hours as needed for pain., Disp: 30 tablet, Rfl: 0   Magnesium 250 MG TABS, Take by mouth., Disp: , Rfl:    metoprolol  succinate (TOPROL -XL) 25 MG 24 hr tablet, Take 1 tablet by mouth once a day for blood pressure, Disp: 90 tablet, Rfl: 4   montelukast  (SINGULAIR ) 10 MG tablet, Take 1 tablet (10 mg total) by mouth at bedtime., Disp: 90 tablet, Rfl: 3   nitrofurantoin , macrocrystal-monohydrate, (MACROBID ) 100 MG capsule, Take one capsule by mouth once daily as directed, Disp: 90 capsule, Rfl: PRN   Olopatadine  HCl (PAZEO) 0.7 % SOLN, Place 1 drop into both eyes daily., Disp: 1 Bottle, Rfl: 5   pantoprazole (PROTONIX) 40 MG tablet, Take 40 mg by mouth 2 (two) times daily., Disp: , Rfl:    rosuvastatin  (CRESTOR ) 5 MG tablet, Take 1 tablet by mouth once a day for cholesterol, Disp: 90 tablet, Rfl: 3   zolpidem  (AMBIEN  CR) 12.5 MG CR tablet, Take one tablet by mouth every day at bedtime., Disp: 60 tablet, Rfl: 5      Objective:   Vitals:   03/13/23 1317  BP: (!) 146/86  Pulse: 82  SpO2: 97%  Weight: 225 lb (102.1 kg)  Height: 5' 6 (1.676 m)    Estimated body mass index is 36.32 kg/m as calculated from the following:   Height as of this encounter: 5' 6 (1.676 m).   Weight as of this encounter: 225 lb (102.1 kg).  @WEIGHTCHANGE @  Filed Weights   03/13/23 1317  Weight: 225 lb (102.1 kg)     Physical Exam   General: No distress. Looks well O2 at rest: no Cane present: no Sitting in wheel chair: no Frail:  no Obese: no Neuro: Alert and Oriented x 3. GCS 15. Speech normal Psych: Pleasant Resp:  Barrel Chest - no.  Wheeze - no, Crackles - no, No overt respiratory distress CVS: Normal heart sounds. Murmurs - no Ext: Stigmata of Connective Tissue Disease - no HEENT: Normal upper airway. PEERL +. No post nasal drip        Assessment:       ICD-10-CM   1. Asthma, well controlled, moderate persistent  J45.40     2. Moderate persistent extrinsic asthma without complication  J45.40     3. Vaccine counseling  Z71.85          Plan:     Patient Instructions     ICD-10-CM   1. Asthma, well controlled, moderate persistent  J45.40     2. Moderate persistent extrinsic asthma without complication  J45.40     3. Vaccine counseling  Z71.85       Well controlled Small amount of mucus that is not an inconvenience Doing well overall $45 per month noted on breo PFT normal sept  2025  PLAN - Remain on Breo 1 puff daily, rinse after use   = will see if we can do 90 day supply ; ask your pharmacy as wel Albuterol  inhaler As needed   Continue Singulair  10mg  At bedtime   Change  Allegra 180mg  daily to as needed and see if things get worse Continue on Protonix Twice daily  and try once daily  Vaccines  - recommend flu shot, RSV shot and pneumonia vaccine  ASAP  - Please talk to PCP Pahwani, Velna SAUNDERS, MD -  and ensure you get  shingrix (GSK) inactivated vaccine against shingles  Follow up with  - Dr. Geronimo in 6 months and As needed      FOLLOWUP Return in about 6 months (around 09/10/2023) for 15 min visit, with Dr Geronimo, Face to Face OR Video Visit.    SIGNATURE    Dr. Dorethia Geronimo, M.D., F.C.C.P,  Pulmonary and Critical Care Medicine Staff Physician, [facility] Director - Interstitial Lung Disease  Program  Pulmonary Fibrosis [facility] at [facility] [place], [place], [address]  Pager: [phone], If no answer or  between  15:00h - 7:00h: call [phone] Telephone: [phone]  5:57 PM 03/13/2023

## 2023-03-13 NOTE — Telephone Encounter (Signed)
 Can we see what inhalers are covered on pts insurance. Using breo states the copay is high

## 2023-03-14 ENCOUNTER — Other Ambulatory Visit (HOSPITAL_COMMUNITY): Payer: Self-pay

## 2023-03-14 NOTE — Telephone Encounter (Signed)
 Per test claims the generic Advair Diskus [facility]) is the cheapest at this time with a $82.52/30 day co-pay. The patient has a deductible to meet which is contributing to the higher prices.

## 2023-03-21 MED ORDER — FLUTICASONE FUROATE-VILANTEROL 100-25 MCG/ACT IN AEPB: 1.0000 | INHALATION_SPRAY | Freq: Every day | RESPIRATORY_TRACT | 3 refills | Status: DC

## 2023-03-21 NOTE — Telephone Encounter (Signed)
Called and spoke with pt to make aware of this. She verbalized understanding nfn   Pt also stated the 3 month supply was not cheaper, and requesting to go back to the single. Order placed nfn

## 2023-04-06 ENCOUNTER — Ambulatory Visit (INDEPENDENT_AMBULATORY_CARE_PROVIDER_SITE_OTHER): Payer: Medicare Other

## 2023-04-06 ENCOUNTER — Encounter: Payer: Self-pay | Admitting: Podiatry

## 2023-04-06 ENCOUNTER — Ambulatory Visit: Payer: Medicare Other | Admitting: Podiatry

## 2023-04-06 DIAGNOSIS — M24574 Contracture, right foot: Secondary | ICD-10-CM

## 2023-04-06 DIAGNOSIS — M79671 Pain in right foot: Secondary | ICD-10-CM | POA: Diagnosis not present

## 2023-04-06 DIAGNOSIS — M7661 Achilles tendinitis, right leg: Secondary | ICD-10-CM

## 2023-04-06 DIAGNOSIS — M62461 Contracture of muscle, right lower leg: Secondary | ICD-10-CM

## 2023-04-06 MED ORDER — DICLOFENAC SODIUM 1 % EX GEL
2.0000 g | Freq: Four times a day (QID) | CUTANEOUS | 2 refills | Status: AC
Start: 2023-04-06 — End: ?

## 2023-04-06 MED ORDER — PREDNISONE 5 MG PO TABS: ORAL_TABLET | ORAL | 0 refills | Status: AC

## 2023-04-06 NOTE — Progress Notes (Signed)
 Subjective:  [name], female    DOB: 1945-10-27,  MRN: [id number]  Chief Complaint  Patient presents with   Foot Pain    Patient states she has sever heel pain that started Saturday, patient has been taking tylenol  extra strength. Patient states her right hallux toe nail wont stay down.    Discussed the use of AI scribe software for clinical note transcription with the patient, who gave verbal consent to proceed.  History of Present Illness   Ashley Knapp is a 78 year old female who presents with heel pain.  She experiences significant heel pain, primarily on the interior and bottom of the heel, with some pain extending to the outside and up the ankle. Earlier in the week, the pain was severe enough to make walking difficult. The pain has been present since at least Sunday. She has a history of plantar fasciitis, noting that this episode feels worse than previous ones. No recent injuries, falls, or changes in activity levels that could have triggered the pain.  She has a history of foot surgery, including a bunionectomy and hammer toe correction. Post-surgery, one toe does not extend properly, and two toes behave like 'Siamese twins'.  She has been advised to avoid NSAIDs like Aleve and Motrin  due to other health considerations. Instead, she takes extra Tylenol  and occasionally uses Flexeril  for hip and back issues, taking half a pill due to its sedative effects.  She has been trying home exercises and stretches, using a strap from a previous knee surgery, and has elevated her bed and chairs to aid in recovery. She prefers home therapy over additional surgery, despite some limitations in toe movement.          Objective:    Physical Exam   MUSCULOSKELETAL: Tenderness on palpation at the posterior aspect of the heel at the insertion of the Achilles tendon. No tenderness on palpation of the plantar fascia. Extensive contracture of the first metatarsophalangeal  (MTP) joint with limited plantar flexion, tight capsule, and extensor tendons dorsally with a well-healed, non hypertrophic scar. Arthritis noted in the big toe joint.       No images are attached to the encounter.    Results   RADIOLOGY   Foot Radiograph: Posterior and plantar calcaneal enthesophytes, previous bunionectomy and hammer toe correction, mild degenerative changes in the joint      Assessment:   1. Tendonitis, Achilles, right   2. Gastrocnemius equinus of right lower extremity   3. Joint contracture of foot, right      Plan:  Patient was evaluated and treated and all questions answered.  Assessment and Plan    Plantar Fasciitis and Achilles Tendonitis Severe pain in the heel and ankle, improving since onset. X-rays show no stress fracture but presence of heel spurs. Pain is due to inflammation of the ligament and tendon where it attaches to the heel spur. -Use walking boot for 3-4 weeks to allow for rest and healing. -Start Prednisone  taper for 1 week to reduce inflammation. -Apply Voltaren  gel 4 times a day for local anti-inflammatory effect. -Start home therapy exercises to improve condition and prevent recurrence. -Check in 6 weeks to assess progress.  Contracture of the First MTP Joint Limited plantar flexion with tight capsule and extensor tendon dorsally. Mild degenerative changes in the joint post bunionectomy and hammer toe correction. -Continue with home therapy exercises focusing on grip strength. -Consider physical therapy if no improvement with home exercises. -Consider surgical intervention if  condition progresses and becomes painful.  Follow-up in 6 weeks to assess progress. If condition worsens at any point, patient to contact the clinic.          Return in about 6 weeks (around 05/18/2023) for re-check Achilles tendon.

## 2023-04-06 NOTE — Patient Instructions (Addendum)
 VISIT SUMMARY:  Today, you were seen for significant heel pain that has been affecting your ability to walk. You have a history of plantar fasciitis, and this episode has been more severe than previous ones. We discussed your current symptoms, reviewed your X-rays, and developed a treatment plan to help alleviate your pain and improve your condition.  YOUR PLAN:  -PLANTAR FASCIITIS AND ACHILLES TENDONITIS: Plantar fasciitis and Achilles tendonitis are conditions that cause pain due to inflammation of the ligament and tendon in your heel. To help reduce the inflammation and pain, you will use a walking boot for 3-4 weeks, start a Prednisone  taper for 1 week, apply Voltaren  gel 4 times a day, and continue with home therapy exercises. We will check your progress in 6 weeks.  -CONTRACTURE OF THE FIRST MTP JOINT: Contracture of the first MTP joint means there is limited movement in your big toe joint due to tightness and mild degenerative changes. You should continue with home therapy exercises focusing on grip strength. If there is no improvement, we may consider physical therapy or surgical intervention if the condition becomes painful.  INSTRUCTIONS:  Please follow up in 6 weeks to assess your progress. If your condition worsens at any point, contact the clinic immediately.  Look for Voltaren  gel at the pharmacy over the counter or online (also known as diclofenac  1% gel). Apply to the painful areas 3-4x daily with the supplied dosing card. Allow to dry for 10 minutes before going into socks/shoes   Achilles Tendinitis  with Rehab Achilles tendinitis is a disorder of the Achilles tendon. The Achilles tendon connects the large calf muscles (Gastrocnemius and Soleus) to the heel bone (calcaneus). This tendon is sometimes called the heel cord. It is important for pushing-off and standing on your toes and is important for walking, running, or jumping. Tendinitis is often caused by overuse and repetitive  microtrauma. SYMPTOMS Pain, tenderness, swelling, warmth, and redness may occur over the Achilles tendon even at rest. Pain with pushing off, or flexing or extending the ankle. Pain that is worsened after or during activity. CAUSES  Overuse sometimes seen with rapid increase in exercise programs or in sports requiring running and jumping. Poor physical conditioning (strength and flexibility or endurance). Running sports, especially training running down hills. Inadequate warm-up before practice or play or failure to stretch before participation. Injury to the tendon. PREVENTION  Warm up and stretch before practice or competition. Allow time for adequate rest and recovery between practices and competition. Keep up conditioning. Keep up ankle and leg flexibility. Improve or keep muscle strength and endurance. Improve cardiovascular fitness. Use proper technique. Use proper equipment (shoes, skates). To help prevent recurrence, taping, protective strapping, or an adhesive bandage may be recommended for several weeks after healing is complete. PROGNOSIS  Recovery may take weeks to several months to heal. Longer recovery is expected if symptoms have been prolonged. Recovery is usually quicker if the inflammation is due to a direct blow as compared with overuse or sudden strain. RELATED COMPLICATIONS  Healing time will be prolonged if the condition is not correctly treated. The injury must be given plenty of time to heal. Symptoms can reoccur if activity is resumed too soon. Untreated, tendinitis may increase the risk of tendon rupture requiring additional time for recovery and possibly surgery. TREATMENT  The first treatment consists of rest anti-inflammatory medication, and ice to relieve the pain. Stretching and strengthening exercises after resolution of pain will likely help reduce the risk of recurrence. Referral  to a physical therapist or athletic trainer for further evaluation and  treatment may be helpful. A walking boot or cast may be recommended to rest the Achilles tendon. This can help break the cycle of inflammation and microtrauma. Arch supports (orthotics) may be prescribed or recommended by your caregiver as an adjunct to therapy and rest. Surgery to remove the inflamed tendon lining or degenerated tendon tissue is rarely necessary and has shown less than predictable results. MEDICATION  Nonsteroidal anti-inflammatory medications, such as aspirin and ibuprofen , may be used for pain and inflammation relief. Do not take within 7 days before surgery. Take these as directed by your caregiver. Contact your caregiver immediately if any bleeding, stomach upset, or signs of allergic reaction occur. Other minor pain relievers, such as acetaminophen , may also be used. Pain relievers may be prescribed as necessary by your caregiver. Do not take prescription pain medication for longer than 4 to 7 days. Use only as directed and only as much as you need. Cortisone injections are rarely indicated. Cortisone injections may weaken tendons and predispose to rupture. It is better to give the condition more time to heal than to use them. HEAT AND COLD Cold is used to relieve pain and reduce inflammation for acute and chronic Achilles tendinitis. Cold should be applied for 10 to 15 minutes every 2 to 3 hours for inflammation and pain and immediately after any activity that aggravates your symptoms. Use ice packs or an ice massage. Heat may be used before performing stretching and strengthening activities prescribed by your caregiver. Use a heat pack or a warm soak. SEEK MEDICAL CARE IF: Symptoms get worse or do not improve in 2 weeks despite treatment. New, unexplained symptoms develop. Drugs used in treatment may produce side effects.  EXERCISES:  RANGE OF MOTION (ROM) AND STRETCHING EXERCISES - Achilles Tendinitis  These exercises may help you when beginning to rehabilitate your  injury. Your symptoms may resolve with or without further involvement from your physician, physical therapist or athletic trainer. While completing these exercises, remember:  Restoring tissue flexibility helps normal motion to return to the joints. This allows healthier, less painful movement and activity. An effective stretch should be held for at least 30 seconds. A stretch should never be painful. You should only feel a gentle lengthening or release in the stretched tissue.  STRETCH  Gastroc, Standing  Place hands on wall. Extend right / left leg, keeping the front knee somewhat bent. Slightly point your toes inward on your back foot. Keeping your right / left heel on the floor and your knee straight, shift your weight toward the wall, not allowing your back to arch. You should feel a gentle stretch in the right / left calf. Hold this position for 10 seconds. Repeat 3 times. Complete this stretch 2 times per day.  STRETCH  Soleus, Standing  Place hands on wall. Extend right / left leg, keeping the other knee somewhat bent. Slightly point your toes inward on your back foot. Keep your right / left heel on the floor, bend your back knee, and slightly shift your weight over the back leg so that you feel a gentle stretch deep in your back calf. Hold this position for 10 seconds. Repeat 3 times. Complete this stretch 2 times per day.  STRETCH  Gastrocsoleus, Standing  Note: This exercise can place a lot of stress on your foot and ankle. Please complete this exercise only if specifically instructed by your caregiver.  Place the ball of your  right / left foot on a step, keeping your other foot firmly on the same step. Hold on to the wall or a rail for balance. Slowly lift your other foot, allowing your body weight to press your heel down over the edge of the step. You should feel a stretch in your right / left calf. Hold this position for 10 seconds. Repeat this exercise with a slight bend in  your knee. Repeat 3 times. Complete this stretch 2 times per day.   STRENGTHENING EXERCISES - Achilles Tendinitis These exercises may help you when beginning to rehabilitate your injury. They may resolve your symptoms with or without further involvement from your physician, physical therapist or athletic trainer. While completing these exercises, remember:  Muscles can gain both the endurance and the strength needed for everyday activities through controlled exercises. Complete these exercises as instructed by your physician, physical therapist or athletic trainer. Progress the resistance and repetitions only as guided. You may experience muscle soreness or fatigue, but the pain or discomfort you are trying to eliminate should never worsen during these exercises. If this pain does worsen, stop and make certain you are following the directions exactly. If the pain is still present after adjustments, discontinue the exercise until you can discuss the trouble with your clinician.  STRENGTH - Plantar-flexors  Sit with your right / left leg extended. Holding onto both ends of a rubber exercise band/tubing, loop it around the ball of your foot. Keep a slight tension in the band. Slowly push your toes away from you, pointing them downward. Hold this position for 10 seconds. Return slowly, controlling the tension in the band/tubing. Repeat 3 times. Complete this exercise 2 times per day.   STRENGTH - Plantar-flexors  Stand with your feet shoulder width apart. Steady yourself with a wall or table using as little support as needed. Keeping your weight evenly spread over the width of your feet, rise up on your toes.* Hold this position for 10 seconds. Repeat 3 times. Complete this exercise 2 times per day.  *If this is too easy, shift your weight toward your right / left leg until you feel challenged. Ultimately, you may be asked to do this exercise with your right / left foot only.  STRENGTH   Plantar-flexors, Eccentric  Note: This exercise can place a lot of stress on your foot and ankle. Please complete this exercise only if specifically instructed by your caregiver.  Place the balls of your feet on a step. With your hands, use only enough support from a wall or rail to keep your balance. Keep your knees straight and rise up on your toes. Slowly shift your weight entirely to your right / left toes and pick up your opposite foot. Gently and with controlled movement, lower your weight through your right / left foot so that your heel drops below the level of the step. You will feel a slight stretch in the back of your calf at the end position. Use the healthy leg to help rise up onto the balls of both feet, then lower weight only on the right / left leg again. Build up to 15 repetitions. Then progress to 3 consecutive sets of 15 repetitions.* After completing the above exercise, complete the same exercise with a slight knee bend (about 30 degrees). Again, build up to 15 repetitions. Then progress to 3 consecutive sets of 15 repetitions.* Perform this exercise 2 times per day.  *When you easily complete 3 sets of 15, your physician,  physical therapist or athletic trainer may advise you to add resistance by wearing a backpack filled with additional weight.  STRENGTH - Plantar Flexors, Seated  Sit on a chair that allows your feet to rest flat on the ground. If necessary, sit at the edge of the chair. Keeping your toes firmly on the ground, lift your right / left heel as far as you can without increasing any discomfort in your ankle. Repeat 3 times. Complete this exercise 2 times a day.

## 2023-04-10 DIAGNOSIS — H25813 Combined forms of age-related cataract, bilateral: Secondary | ICD-10-CM | POA: Diagnosis not present

## 2023-04-10 DIAGNOSIS — H40023 Open angle with borderline findings, high risk, bilateral: Secondary | ICD-10-CM | POA: Diagnosis not present

## 2023-04-10 DIAGNOSIS — H524 Presbyopia: Secondary | ICD-10-CM | POA: Diagnosis not present

## 2023-04-10 DIAGNOSIS — H04123 Dry eye syndrome of bilateral lacrimal glands: Secondary | ICD-10-CM | POA: Diagnosis not present

## 2023-05-08 ENCOUNTER — Other Ambulatory Visit: Payer: Self-pay | Admitting: Obstetrics and Gynecology

## 2023-05-08 DIAGNOSIS — Z0000 Encounter for general adult medical examination without abnormal findings: Secondary | ICD-10-CM

## 2023-05-22 ENCOUNTER — Ambulatory Visit: Payer: Medicare Other | Admitting: Podiatry

## 2023-05-22 DIAGNOSIS — M62461 Contracture of muscle, right lower leg: Secondary | ICD-10-CM

## 2023-05-22 DIAGNOSIS — M7661 Achilles tendinitis, right leg: Secondary | ICD-10-CM | POA: Diagnosis not present

## 2023-05-22 NOTE — Progress Notes (Signed)
  Subjective:  [name], female    DOB: May 19, 1945,  MRN: [id number]  Chief Complaint  Patient presents with   Tendonitis    Rm 8: Return in about 6 weeks (around 05/18/2023) for re-check Achilles tendon. Pt states the prednisone helped and she is feeling much better.    78 y.o. female presents with the above complaint. History confirmed with patient.  She is doing well better is not having any pain the prednisone helped quite a bit she could not tolerate the boot very much she has been doing her home therapy exercises  Objective:  Physical Exam: warm, good capillary refill, no trophic changes or ulcerative lesions, normal DP and PT pulses, normal sensory exam, and no pain in the posterior heel good 5 out of 5 strength.  Assessment:   1. Tendonitis, Achilles, right   2. Gastrocnemius equinus of right lower extremity      Plan:  Patient was evaluated and treated and all questions answered.  Doing well and is nearly fully healed.  Continue home therapy exercises on a regular basis to prevent recurrence.  We discussed possibility of this.  Return to see me as needed if it returns.  Return if symptoms worsen or fail to improve.

## 2023-06-16 ENCOUNTER — Ambulatory Visit

## 2023-06-20 DIAGNOSIS — J4520 Mild intermittent asthma, uncomplicated: Secondary | ICD-10-CM | POA: Diagnosis not present

## 2023-06-20 DIAGNOSIS — I10 Essential (primary) hypertension: Secondary | ICD-10-CM | POA: Diagnosis not present

## 2023-06-20 DIAGNOSIS — R739 Hyperglycemia, unspecified: Secondary | ICD-10-CM | POA: Diagnosis not present

## 2023-06-20 DIAGNOSIS — N1831 Chronic kidney disease, stage 3a: Secondary | ICD-10-CM | POA: Diagnosis not present

## 2023-06-20 DIAGNOSIS — R600 Localized edema: Secondary | ICD-10-CM | POA: Diagnosis not present

## 2023-06-23 ENCOUNTER — Ambulatory Visit
Admission: RE | Admit: 2023-06-23 | Discharge: 2023-06-23 | Disposition: A | Discharge status: Home / Self Care | Source: Ambulatory Visit | Attending: Obstetrics and Gynecology | Admitting: Obstetrics and Gynecology

## 2023-06-23 DIAGNOSIS — Z1231 Encounter for screening mammogram for malignant neoplasm of breast: Secondary | ICD-10-CM | POA: Diagnosis not present

## 2023-06-23 DIAGNOSIS — Z0000 Encounter for general adult medical examination without abnormal findings: Secondary | ICD-10-CM

## 2023-07-21 ENCOUNTER — Ambulatory Visit
Admission: RE | Admit: 2023-07-21 | Discharge: 2023-07-21 | Disposition: A | Discharge status: Home / Self Care | Source: Ambulatory Visit | Attending: Physician Assistant | Admitting: Physician Assistant

## 2023-07-21 ENCOUNTER — Other Ambulatory Visit: Payer: Self-pay | Admitting: Physician Assistant

## 2023-07-21 DIAGNOSIS — R109 Unspecified abdominal pain: Secondary | ICD-10-CM | POA: Diagnosis not present

## 2023-07-21 DIAGNOSIS — N1832 Chronic kidney disease, stage 3b: Secondary | ICD-10-CM | POA: Diagnosis not present

## 2023-07-21 DIAGNOSIS — I10 Essential (primary) hypertension: Secondary | ICD-10-CM | POA: Diagnosis not present

## 2023-07-21 DIAGNOSIS — Z1211 Encounter for screening for malignant neoplasm of colon: Secondary | ICD-10-CM | POA: Diagnosis not present

## 2023-07-21 DIAGNOSIS — K5900 Constipation, unspecified: Secondary | ICD-10-CM | POA: Diagnosis not present

## 2023-07-21 DIAGNOSIS — R609 Edema, unspecified: Secondary | ICD-10-CM | POA: Diagnosis not present

## 2023-07-21 DIAGNOSIS — R197 Diarrhea, unspecified: Secondary | ICD-10-CM | POA: Diagnosis not present

## 2023-07-28 ENCOUNTER — Other Ambulatory Visit: Payer: Self-pay | Admitting: Internal Medicine

## 2023-08-11 DIAGNOSIS — R195 Other fecal abnormalities: Secondary | ICD-10-CM | POA: Diagnosis not present

## 2023-08-11 DIAGNOSIS — Z860100 Personal history of colon polyps, unspecified: Secondary | ICD-10-CM | POA: Diagnosis not present

## 2023-08-11 DIAGNOSIS — K219 Gastro-esophageal reflux disease without esophagitis: Secondary | ICD-10-CM | POA: Diagnosis not present

## 2023-10-05 DIAGNOSIS — K449 Diaphragmatic hernia without obstruction or gangrene: Secondary | ICD-10-CM | POA: Diagnosis not present

## 2023-10-05 DIAGNOSIS — Z09 Encounter for follow-up examination after completed treatment for conditions other than malignant neoplasm: Secondary | ICD-10-CM | POA: Diagnosis not present

## 2023-10-05 DIAGNOSIS — Z860100 Personal history of colon polyps, unspecified: Secondary | ICD-10-CM | POA: Diagnosis not present

## 2023-10-05 DIAGNOSIS — K219 Gastro-esophageal reflux disease without esophagitis: Secondary | ICD-10-CM | POA: Diagnosis not present

## 2023-10-05 DIAGNOSIS — K5730 Diverticulosis of large intestine without perforation or abscess without bleeding: Secondary | ICD-10-CM | POA: Diagnosis not present

## 2023-10-11 DIAGNOSIS — H40023 Open angle with borderline findings, high risk, bilateral: Secondary | ICD-10-CM | POA: Diagnosis not present

## 2023-11-14 ENCOUNTER — Other Ambulatory Visit: Payer: Self-pay | Admitting: Adult Health

## 2023-11-15 ENCOUNTER — Other Ambulatory Visit: Payer: Self-pay | Admitting: Internal Medicine

## 2023-12-05 DIAGNOSIS — R195 Other fecal abnormalities: Secondary | ICD-10-CM | POA: Diagnosis not present

## 2023-12-05 DIAGNOSIS — K219 Gastro-esophageal reflux disease without esophagitis: Secondary | ICD-10-CM | POA: Diagnosis not present

## 2023-12-05 DIAGNOSIS — Z9049 Acquired absence of other specified parts of digestive tract: Secondary | ICD-10-CM | POA: Diagnosis not present

## 2024-01-03 ENCOUNTER — Other Ambulatory Visit: Payer: Self-pay | Admitting: Internal Medicine

## 2024-03-24 ENCOUNTER — Other Ambulatory Visit: Payer: Self-pay | Admitting: Internal Medicine

## 2024-03-29 ENCOUNTER — Telehealth: Payer: Self-pay

## 2024-03-29 MED ORDER — FLUTICASONE FUROATE-VILANTEROL 100-25 MCG/ACT IN AEPB: 1.0000 | INHALATION_SPRAY | Freq: Every day | RESPIRATORY_TRACT | 0 refills | Status: AC

## 2024-03-29 NOTE — Telephone Encounter (Signed)
 Copied from CRM #[id number]. Topic: Clinical - Medication Refill >> Mar 29, 2024  9:56 AM Russell PARAS wrote: Medication: BREO ELLIPTA  100-25 MCG/ACT AEPB  Pt is requesting bridge refill to cover her until next scheduled OV on 3/19 at 2:30 Pm. She only has 3 puffs left on inhaler  Has the patient contacted their pharmacy? Yes (Agent: If no, request that the patient contact the pharmacy for the refill. If patient does not wish to contact the pharmacy document the reason why and proceed with request.) (Agent: If yes, when and what did the pharmacy advise?)  This is the patient's preferred pharmacy:  [id number] [place] [place], [place] - [address] [place] [address] Phone: [phone] Fax: [phone]  Is this the correct pharmacy for this prescription? Yes If no, delete pharmacy and type the correct one.   Has the prescription been filled recently? Yes  Is the patient out of the medication? Yes  Has the patient been seen for an appointment in the last year OR does the patient have an upcoming appointment? Yes, 05/16/2024 w/[facility]  Can we respond through MyChart? Yes  Agent: Please be advised that Rx refills may take up to 3 business days. We ask that you follow-up with your pharmacy.  I called and spoke to pt. I informed pt that I could send in courtesy refills to last her until her upcoming appt but she will need to keep her appt so we can continue refills. Pt verbalized understanding. Sending refills now.

## 2024-05-16 ENCOUNTER — Ambulatory Visit: Admitting: Adult Health
